# Patient Record
Sex: Male | Born: 1965
Health system: Southern US, Community
[De-identification: ages and names within clinical notes are randomized; demographics above are authoritative.]

## PROBLEM LIST (undated history)

## (undated) DIAGNOSIS — K219 Gastro-esophageal reflux disease without esophagitis: Secondary | ICD-10-CM

## (undated) DIAGNOSIS — K5792 Diverticulitis of intestine, part unspecified, without perforation or abscess without bleeding: Secondary | ICD-10-CM

## (undated) HISTORY — DX: Diverticulitis of intestine, part unspecified, without perforation or abscess without bleeding: K57.92

## (undated) HISTORY — DX: Gastro-esophageal reflux disease without esophagitis: K21.9

---

## 2004-07-12 ENCOUNTER — Emergency Department (HOSPITAL_COMMUNITY): Admission: EM | Admit: 2004-07-12 | Discharge: 2004-07-12 | Payer: Self-pay | Admitting: Emergency Medicine

## 2004-08-30 ENCOUNTER — Ambulatory Visit (HOSPITAL_COMMUNITY): Admission: RE | Admit: 2004-08-30 | Discharge: 2004-08-30 | Payer: Self-pay | Admitting: Gastroenterology

## 2004-09-07 ENCOUNTER — Encounter: Admission: RE | Admit: 2004-09-07 | Discharge: 2004-09-07 | Payer: Self-pay | Admitting: Gastroenterology

## 2005-11-28 HISTORY — PX: COLECTOMY: SHX59

## 2005-12-02 ENCOUNTER — Emergency Department (HOSPITAL_COMMUNITY): Admission: EM | Admit: 2005-12-02 | Discharge: 2005-12-02 | Payer: Self-pay | Admitting: Family Medicine

## 2006-09-08 ENCOUNTER — Inpatient Hospital Stay (HOSPITAL_COMMUNITY): Admission: EM | Admit: 2006-09-08 | Discharge: 2006-09-17 | Payer: Self-pay | Admitting: Emergency Medicine

## 2006-09-09 ENCOUNTER — Encounter (INDEPENDENT_AMBULATORY_CARE_PROVIDER_SITE_OTHER): Payer: Self-pay | Admitting: Specialist

## 2006-11-13 ENCOUNTER — Encounter: Admission: RE | Admit: 2006-11-13 | Discharge: 2006-11-13 | Payer: Self-pay | Admitting: General Surgery

## 2006-11-28 HISTORY — PX: COLON SURGERY: SHX602

## 2006-11-28 HISTORY — PX: APPENDECTOMY: SHX54

## 2006-12-15 ENCOUNTER — Inpatient Hospital Stay (HOSPITAL_COMMUNITY): Admission: RE | Admit: 2006-12-15 | Discharge: 2006-12-22 | Payer: Self-pay | Admitting: General Surgery

## 2006-12-15 ENCOUNTER — Encounter (INDEPENDENT_AMBULATORY_CARE_PROVIDER_SITE_OTHER): Payer: Self-pay | Admitting: Specialist

## 2007-01-24 ENCOUNTER — Encounter: Admission: RE | Admit: 2007-01-24 | Discharge: 2007-01-24 | Payer: Self-pay | Admitting: General Surgery

## 2007-02-02 ENCOUNTER — Encounter: Admission: RE | Admit: 2007-02-02 | Discharge: 2007-02-02 | Payer: Self-pay | Admitting: Internal Medicine

## 2007-02-02 ENCOUNTER — Inpatient Hospital Stay (HOSPITAL_COMMUNITY): Admission: EM | Admit: 2007-02-02 | Discharge: 2007-02-14 | Payer: Self-pay | Admitting: Emergency Medicine

## 2007-02-08 ENCOUNTER — Encounter (INDEPENDENT_AMBULATORY_CARE_PROVIDER_SITE_OTHER): Payer: Self-pay | Admitting: *Deleted

## 2007-02-27 ENCOUNTER — Encounter: Admission: RE | Admit: 2007-02-27 | Discharge: 2007-02-27 | Payer: Self-pay | Admitting: General Surgery

## 2007-08-15 ENCOUNTER — Encounter: Admission: RE | Admit: 2007-08-15 | Discharge: 2007-08-15 | Payer: Self-pay | Admitting: Internal Medicine

## 2007-08-31 ENCOUNTER — Ambulatory Visit (HOSPITAL_COMMUNITY): Admission: RE | Admit: 2007-08-31 | Discharge: 2007-08-31 | Payer: Self-pay | Admitting: General Surgery

## 2007-09-04 ENCOUNTER — Ambulatory Visit (HOSPITAL_COMMUNITY): Admission: RE | Admit: 2007-09-04 | Discharge: 2007-09-04 | Payer: Self-pay | Admitting: General Surgery

## 2007-09-06 ENCOUNTER — Inpatient Hospital Stay (HOSPITAL_COMMUNITY): Admission: EM | Admit: 2007-09-06 | Discharge: 2007-09-11 | Payer: Self-pay | Admitting: General Surgery

## 2007-10-16 ENCOUNTER — Ambulatory Visit (HOSPITAL_COMMUNITY): Admission: RE | Admit: 2007-10-16 | Discharge: 2007-10-16 | Payer: Self-pay | Admitting: General Surgery

## 2007-11-07 ENCOUNTER — Inpatient Hospital Stay (HOSPITAL_COMMUNITY): Admission: RE | Admit: 2007-11-07 | Discharge: 2007-11-20 | Payer: Self-pay | Admitting: General Surgery

## 2007-11-07 ENCOUNTER — Encounter (INDEPENDENT_AMBULATORY_CARE_PROVIDER_SITE_OTHER): Payer: Self-pay | Admitting: General Surgery

## 2007-11-26 ENCOUNTER — Ambulatory Visit (HOSPITAL_COMMUNITY): Admission: RE | Admit: 2007-11-26 | Discharge: 2007-11-26 | Payer: Self-pay | Admitting: General Surgery

## 2007-11-27 ENCOUNTER — Ambulatory Visit (HOSPITAL_COMMUNITY): Admission: RE | Admit: 2007-11-27 | Discharge: 2007-11-27 | Payer: Self-pay | Admitting: General Surgery

## 2007-11-29 HISTORY — PX: ABDOMINAL DEBRIDEMENT: SHX1109

## 2007-12-05 ENCOUNTER — Ambulatory Visit (HOSPITAL_COMMUNITY): Admission: RE | Admit: 2007-12-05 | Discharge: 2007-12-05 | Payer: Self-pay | Admitting: General Surgery

## 2007-12-10 ENCOUNTER — Ambulatory Visit (HOSPITAL_COMMUNITY): Admission: RE | Admit: 2007-12-10 | Discharge: 2007-12-10 | Payer: Self-pay | Admitting: General Surgery

## 2007-12-26 ENCOUNTER — Encounter: Admission: RE | Admit: 2007-12-26 | Discharge: 2007-12-26 | Payer: Self-pay | Admitting: Surgery

## 2008-01-15 ENCOUNTER — Encounter: Admission: RE | Admit: 2008-01-15 | Discharge: 2008-01-15 | Payer: Self-pay | Admitting: General Surgery

## 2008-02-16 ENCOUNTER — Encounter (INDEPENDENT_AMBULATORY_CARE_PROVIDER_SITE_OTHER): Payer: Self-pay | Admitting: General Surgery

## 2008-02-16 ENCOUNTER — Ambulatory Visit (HOSPITAL_COMMUNITY): Admission: RE | Admit: 2008-02-16 | Discharge: 2008-02-16 | Payer: Self-pay | Admitting: General Surgery

## 2008-11-10 ENCOUNTER — Ambulatory Visit: Payer: Self-pay | Admitting: Internal Medicine

## 2008-11-14 DIAGNOSIS — Z8719 Personal history of other diseases of the digestive system: Secondary | ICD-10-CM | POA: Insufficient documentation

## 2008-11-14 DIAGNOSIS — K219 Gastro-esophageal reflux disease without esophagitis: Secondary | ICD-10-CM | POA: Insufficient documentation

## 2008-12-07 IMAGING — RF DG BE THRU COLOSTOMY
19 series · 19 of 19 positions shown · non-contrast
Comparison: none

CLINICAL DATA: Diverticulitis now with colostomy for reversal. 
BARIUM ENEMA THROUGH RECTUM INTO COLOSTOMY:
KUB:  A supine film of the abdomen shows a non specific bowel gas pattern.  An ostomy is noted in the left lower quadrant. 
Initially barium enema was performed via rectum.  The best possible study was performed in this patient who had extreme difficulty retaining the barium and balloon tip.  The rectosigmoid colon tapers near the ostomy presumably post operative in nature.  It is difficult to exclude a very small diverticulum at the site of closure.  Post evacuation film shows no abnormality.  Barium via the colostomy was then performed.  There are two or three small diverticula in the descending colon just above the ostomy.  No other diverticula are noted.  Cecum and appendix fill normally.  No reflux into the terminal ileum was obtained.

[Series 1: run · 1 of 1 slices shown (1 of 19)]
[im 1/1]
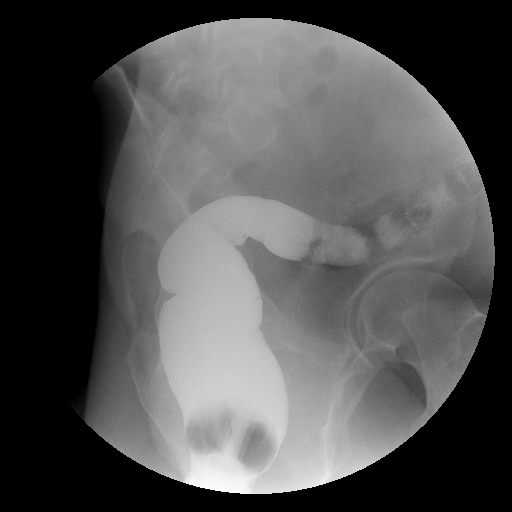

[Series 2: run · 1 of 1 slices shown (2 of 19)]
[im 1/1]
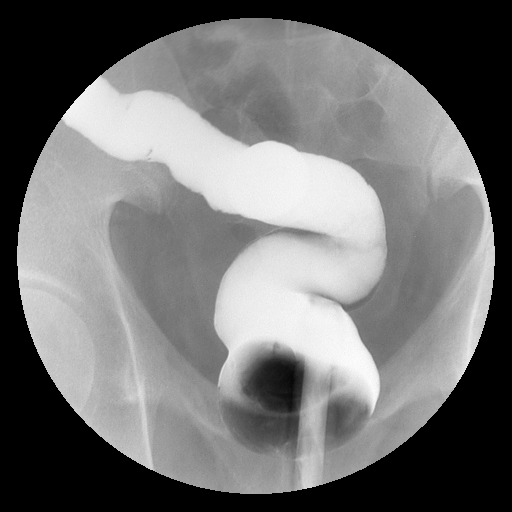

[Series 3: run · 1 of 1 slices shown (3 of 19)]
[im 1/1]
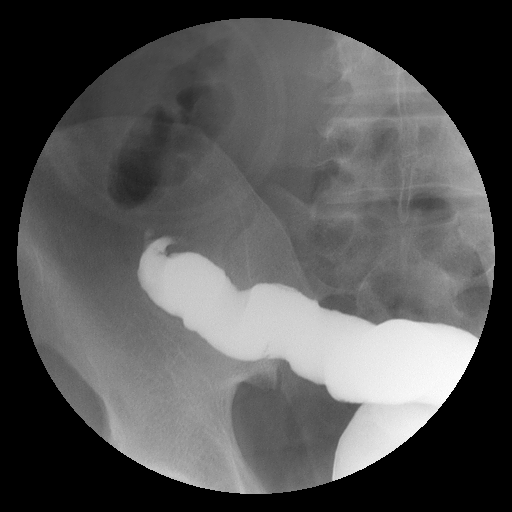

[Series 4: run · 1 of 1 slices shown (4 of 19)]
[im 1/1]
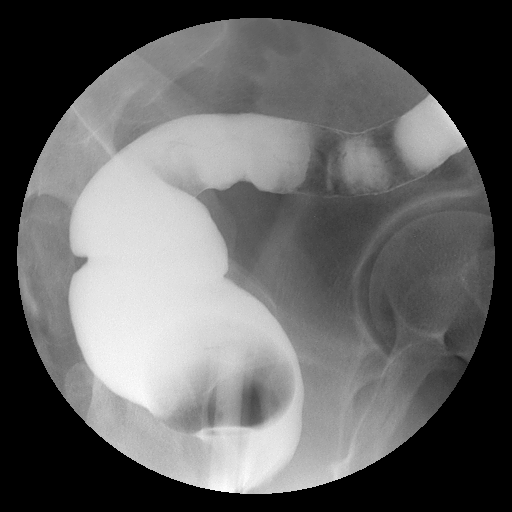

[Series 5: run · 1 of 1 slices shown (5 of 19)]
[im 1/1]
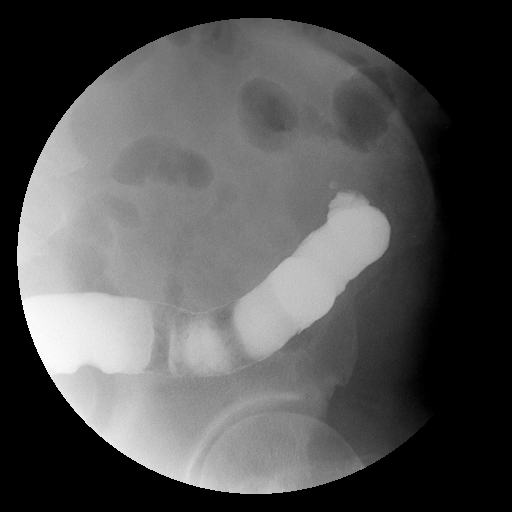

[Series 6: run · 1 of 1 slices shown (6 of 19)]
[im 1/1]
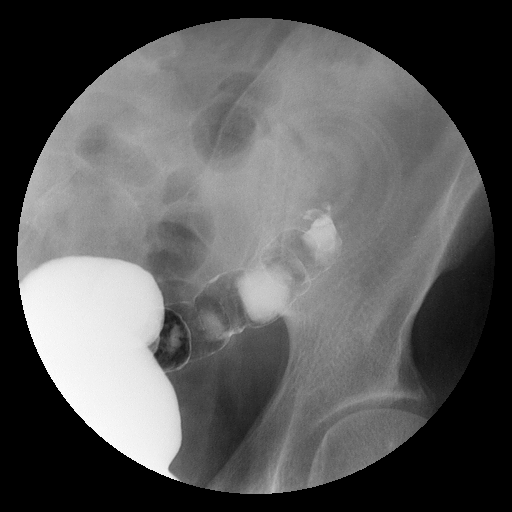

[Series 7: run · 1 of 1 slices shown (7 of 19)]
[im 1/1]
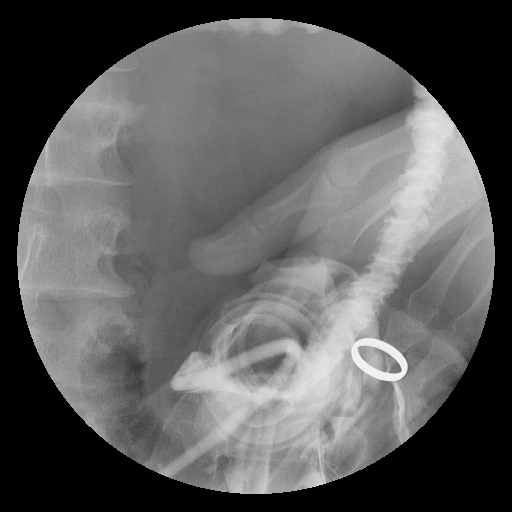

[Series 8: run · 1 of 1 slices shown (8 of 19)]
[im 1/1]
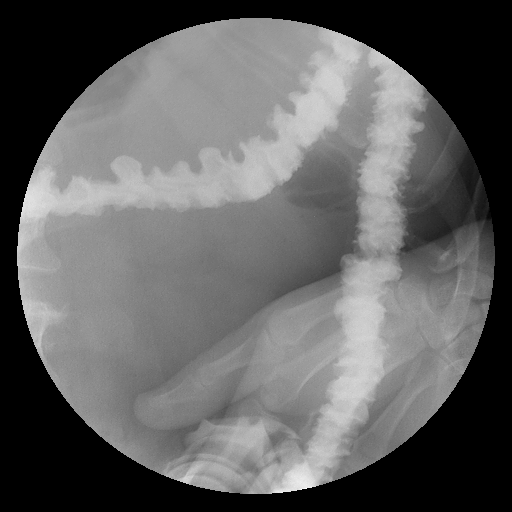

[Series 9: run · 1 of 1 slices shown (9 of 19)]
[im 1/1]
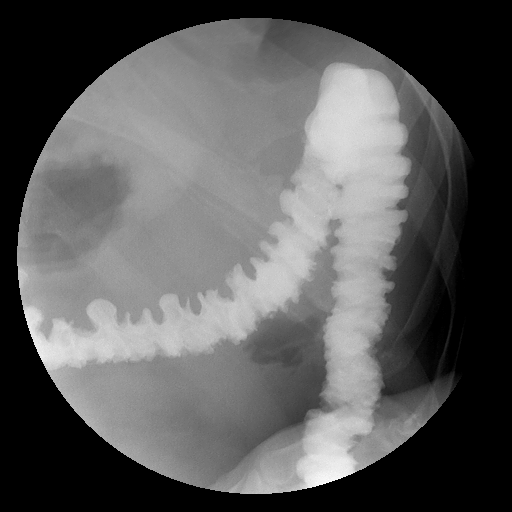

[Series 10: run · 1 of 1 slices shown (10 of 19)]
[im 1/1]
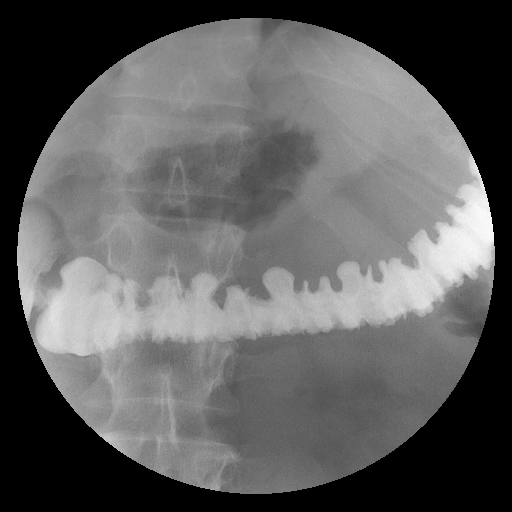

[Series 11: run · 1 of 1 slices shown (11 of 19)]
[im 1/1]
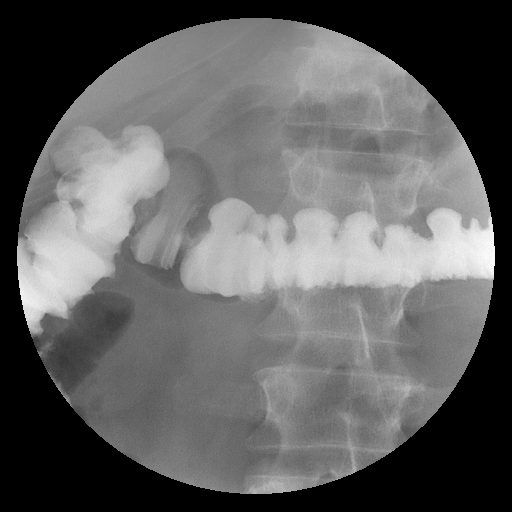

[Series 12: run · 1 of 1 slices shown (12 of 19)]
[im 1/1]
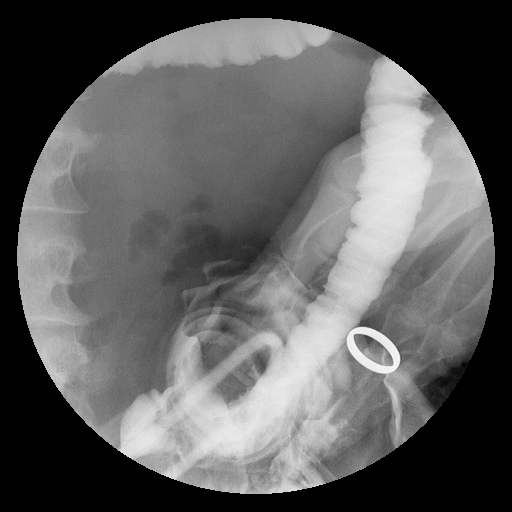

[Series 13: run · 1 of 1 slices shown (13 of 19)]
[im 1/1]
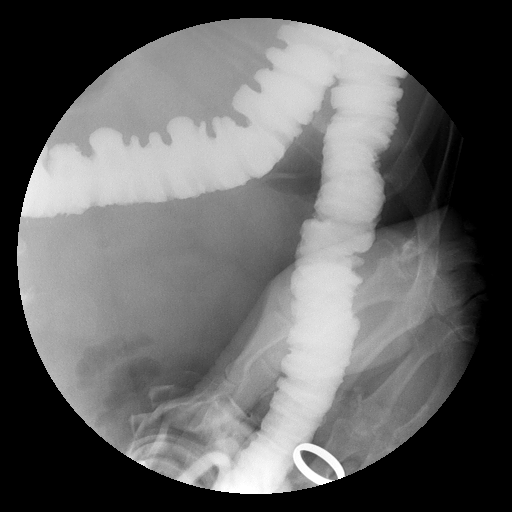

[Series 14: run · 1 of 1 slices shown (14 of 19)]
[im 1/1]
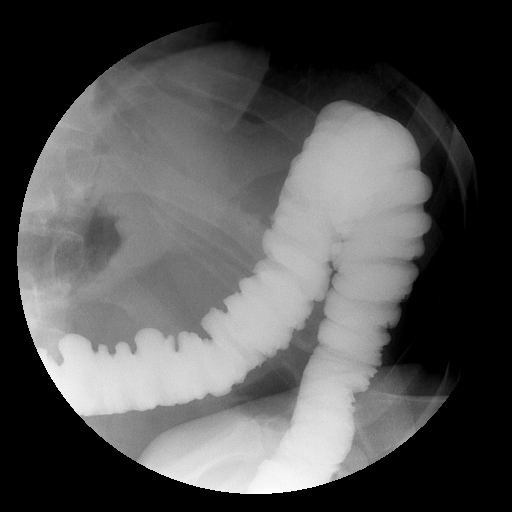

[Series 15: run · 1 of 1 slices shown (15 of 19)]
[im 1/1]
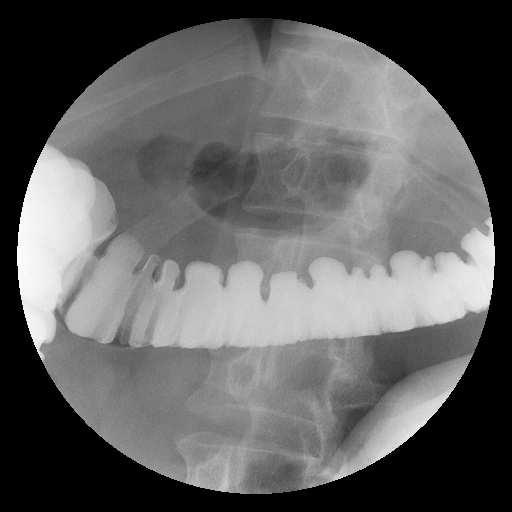

[Series 16: run · 1 of 1 slices shown (16 of 19)]
[im 1/1]
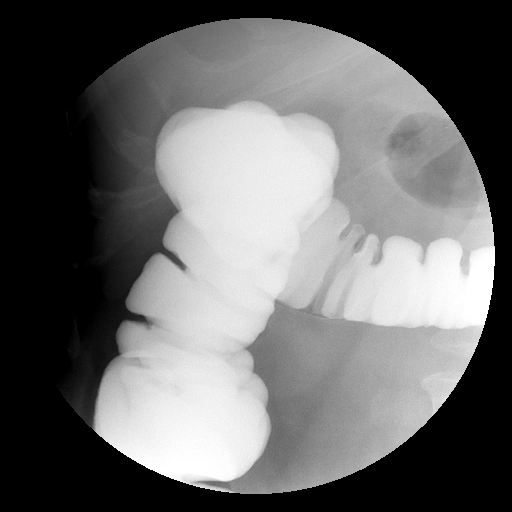

[Series 17: run · 1 of 1 slices shown (17 of 19)]
[im 1/1]
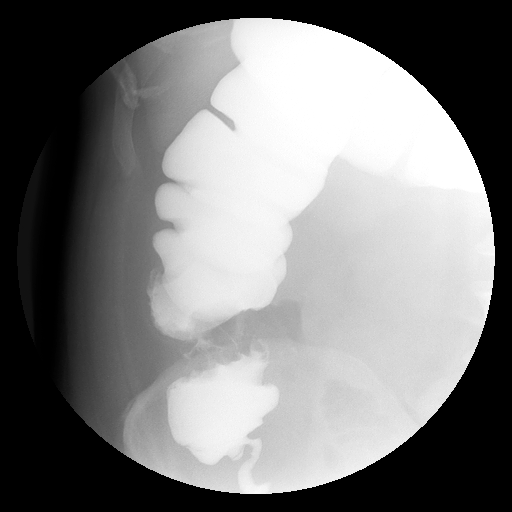

[Series 18: run · 1 of 1 slices shown (18 of 19)]
[im 1/1]
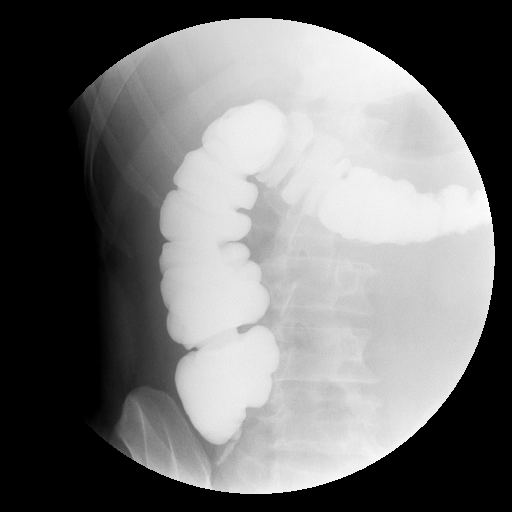

[Series 19: run · 1 of 1 slices shown (19 of 19)]
[im 1/1]
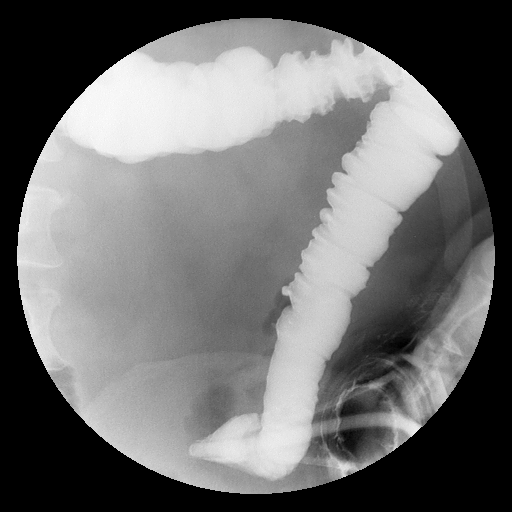

[19 of 19 positions shown; findings below may reference images not displayed]

IMPRESSION: 1.  There are two or three small diverticula in the mid descending colon.  No other diverticula or colonic lesion is seen. 
2.  Tapering of rectosigmoid colon near ostomy.  Post surgical defect vs possible small diverticulum near the site of surgical closure.

## 2009-02-19 ENCOUNTER — Encounter: Payer: Self-pay | Admitting: Internal Medicine

## 2009-02-20 ENCOUNTER — Encounter: Admission: RE | Admit: 2009-02-20 | Discharge: 2009-02-20 | Payer: Self-pay | Admitting: Gastroenterology

## 2010-04-01 ENCOUNTER — Ambulatory Visit: Payer: Self-pay | Admitting: Internal Medicine

## 2010-04-01 LAB — CONVERTED CEMR LAB
AST: 22 units/L (ref 0–37)
Albumin: 4.1 g/dL (ref 3.5–5.2)
Alkaline Phosphatase: 59 units/L (ref 39–117)
BUN: 8 mg/dL (ref 6–23)
Basophils Relative: 0.3 % (ref 0.0–3.0)
CO2: 29 meq/L (ref 19–32)
Eosinophils Absolute: 0.2 10*3/uL (ref 0.0–0.7)
HDL: 29.4 mg/dL — ABNORMAL LOW (ref >39.00)
Hemoglobin: 14.8 g/dL (ref 13.0–17.0)
Lymphocytes Relative: 27.4 % (ref 12.0–46.0)
MCHC: 34.6 g/dL (ref 30.0–36.0)
Monocytes Relative: 6.9 % (ref 3.0–12.0)
Neutro Abs: 5.4 10*3/uL (ref 1.4–7.7)
Potassium: 5.2 meq/L — ABNORMAL HIGH (ref 3.5–5.1)
RBC: 4.47 M/uL (ref 4.22–5.81)
Sodium: 142 meq/L (ref 135–145)
Total CHOL/HDL Ratio: 6
Total Protein: 6.6 g/dL (ref 6.0–8.3)

## 2010-04-02 ENCOUNTER — Encounter: Payer: Self-pay | Admitting: Internal Medicine

## 2010-08-27 ENCOUNTER — Ambulatory Visit: Payer: Self-pay | Admitting: Internal Medicine

## 2010-08-27 ENCOUNTER — Encounter: Payer: Self-pay | Admitting: Internal Medicine

## 2010-08-27 DIAGNOSIS — M25569 Pain in unspecified knee: Secondary | ICD-10-CM | POA: Insufficient documentation

## 2010-08-27 DIAGNOSIS — M109 Gout, unspecified: Secondary | ICD-10-CM | POA: Insufficient documentation

## 2010-12-19 ENCOUNTER — Encounter: Payer: Self-pay | Admitting: General Surgery

## 2010-12-28 NOTE — Letter (Signed)
Summary: Lipid Letter  [facility]  [address] [place], [place] [address]   Phone: [phone]  Fax: [phone]    04/02/2010  Edwards Mckelvie [address] [place], [place]  [address]  Dear Christopher Guerrero:  We have carefully reviewed your last lipid profile from 04/01/2010 and the results are noted below with a summary of recommendations for lipid management.    Cholesterol:       162     Goal: <200   HDL "good" Cholesterol:   38.75     Goal: >40   LDL "bad" Cholesterol:   95     Goal: <130   Triglycerides:       187.0     Goal: <150        TLC Diet (Therapeutic Lifestyle Change): Saturated Fats & Transfatty acids should be kept < 7% of total calories ***Reduce Saturated Fats Polyunstaurated Fat can be up to 10% of total calories Monounsaturated Fat Fat can be up to 20% of total calories Total Fat should be no greater than 25-35% of total calories Carbohydrates should be 50-60% of total calories Protein should be approximately 15% of total calories Fiber should be at least 20-30 grams a day ***Increased fiber may help lower LDL Total Cholesterol should be < 200mg /day Consider adding plant stanol/sterols to diet (example: Benacol spread) ***A higher intake of unsaturated fat may reduce Triglycerides and Increase HDL    Adjunctive Measures (may lower LIPIDS and reduce risk of Heart Attack) include: Aerobic Exercise (20-30 minutes 3-4 times a week) Limit Alcohol Consumption Weight Reduction Aspirin 75-81 mg a day by mouth (if not allergic or contraindicated) Dietary Fiber 20-30 grams a day by mouth     Current Medications: 1)    Prevacid Solutab 30 Mg Tbdp (Lansoprazole) .... One by mouth once daily 2)    Colcrys 0.6 Mg Tabs (Colchicine) .... One by mouth two times a day for gout pain  If you have any questions, please call. We appreciate being able to work with you.   Sincerely,    [facility] Etta Grandchild MD

## 2010-12-28 NOTE — Assessment & Plan Note (Signed)
Summary: RED HOT SWOLLEN TOE--STC   Vital Signs:  Patient profile:   45 year old male Height:      71 inches Weight:      208 pounds BMI:     29.11 O2 Sat:      98 % on Room air Temp:     97.7 degrees F oral Pulse rate:   66 / minute Pulse rhythm:   regular Resp:     16 per minute BP sitting:   122 / 86  (left arm) Cuff size:   large  Vitals Entered By: Rock Nephew CMA (Apr 01, 2010 10:09 AM)  Nutrition Counseling: Patient's BMI is greater than 25 and therefore counseled on weight management options.  O2 Flow:  Room air CC: L toe swelling and pain x 1 1/2wk with dizziness Is Patient Diabetic? No   Primary Care Provider:  Etta Grandchild MD  CC:  L toe swelling and pain x 1 1/2wk with dizziness.  History of Present Illness: He returns c/o 1.5 week hx. of pain, redness, and swelling in his left 4 th toe and intermittent dizziness. The dizziness occurred last weekend after he had been working in the yard and drinking beer. He describes an orthostasis type of dizziness.  Preventive Screening-Counseling & Management  Alcohol-Tobacco     Alcohol drinks/day: 0     Smoking Status: current     Smoking Cessation Counseling: yes     Packs/Day: 1     Year Started: 1991     Cans of tobacco/week: no     Passive Smoke Exposure: yes  Hep-HIV-STD-Contraception     Hepatitis Risk: no risk noted     HIV Risk: no     STD Risk: no risk noted  Current Medications (verified): 1)  Prevacid Solutab 30 Mg Tbdp (Lansoprazole) .... One By Mouth Once Daily  Allergies (verified): No Known Drug Allergies  Past History:  Past Medical History: Reviewed history from 11/10/2008 and no changes required. Diverticulitis, hx of GERD  Past Surgical History: Reviewed history from 11/10/2008 and no changes required. Appendectomy Colectomy  Family History: Reviewed history from 11/10/2008 and no changes required. Family History of Arthritis Family History of CAD Male 1st degree relative  <50 Family History Hypertension  Social History: Reviewed history from 11/10/2008 and no changes required. Occupation: Engineer, site Married Current Smoker Alcohol use-no Drug use-no Regular exercise-yes Hepatitis Risk:  no risk noted STD Risk:  no risk noted  Review of Systems  The patient denies anorexia, fever, weight loss, weight gain, vision loss, chest pain, syncope, dyspnea on exertion, peripheral edema, prolonged cough, headaches, hemoptysis, abdominal pain, melena, hematochezia, severe indigestion/heartburn, hematuria, suspicious skin lesions, transient blindness, difficulty walking, depression, enlarged lymph nodes, angioedema, and testicular masses.    Physical Exam  General:  alert, well-developed, well-nourished, well-hydrated, appropriate dress, normal appearance, healthy-appearing, and cooperative to examination.   Head:  normocephalic, atraumatic, no abnormalities observed, and no abnormalities palpated.   Eyes:  vision grossly intact, pupils equal, pupils round, and pupils reactive to light.   Mouth:  Oral mucosa and oropharynx without lesions or exudates.  Teeth in good repair. Neck:  supple, full ROM, no masses, no thyromegaly, no thyroid nodules or tenderness, no JVD, normal carotid upstroke, and no carotid bruits.   Lungs:  normal respiratory effort, no intercostal retractions, no accessory muscle use, normal breath sounds, no dullness, no fremitus, no crackles, and no wheezes.   Heart:  normal rate, regular rhythm, no murmur, no  gallop, no rub, and no JVD.   Abdomen:  soft, non-tender, normal bowel sounds, no distention, no masses, no guarding, no rigidity, no rebound tenderness, no abdominal hernia, no inguinal hernia, no hepatomegaly, no splenomegaly, and abdominal scar(s).   Rectal:  No external abnormalities noted. Normal sphincter tone. No rectal masses or tenderness. heme negative stool. Genitalia:  circumcised, no hydrocele, no varicocele, no scrotal  masses, no testicular masses or atrophy, no cutaneous lesions, and no urethral discharge.   Prostate:  no nodules, no asymmetry, no induration, and 1+ enlarged.   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 there is erythema and swelling but no warmth or streaking. Pulses:  R and L carotid,radial,femoral,dorsalis pedis and posterior tibial pulses are full and equal bilaterally Extremities:  No clubbing, cyanosis, edema, or deformity noted with normal full range of motion of all joints.   Neurologic:  No cranial nerve deficits noted. Station and gait are normal. Plantar reflexes are down-going bilaterally. DTRs are symmetrical throughout. Sensory, motor and coordinative functions appear intact. Skin:  turgor normal, color normal, no rashes, no suspicious lesions, no ecchymoses, no petechiae, no purpura, no ulcerations, and no edema.   Cervical Nodes:  no anterior cervical adenopathy and no posterior cervical adenopathy.   Axillary Nodes:  no R axillary adenopathy and no L axillary adenopathy.   Inguinal Nodes:  no R inguinal adenopathy and no L inguinal adenopathy.   Psych:  Cognition and judgment appear intact. Alert and cooperative with normal attention span and concentration. No apparent delusions, illusions, hallucinations Additional Exam:  EKG is normal.   Impression & Recommendations:  Problem # 1:  DIZZINESS (ICD-780.4) Assessment New  Orders: Venipuncture (13086) TLB-Lipid Panel (80061-LIPID) TLB-BMP (Basic Metabolic Panel-BMET) (80048-METABOL) TLB-CBC Platelet - w/Differential (85025-CBCD) TLB-Hepatic/Liver Function Pnl (80076-HEPATIC) TLB-TSH (Thyroid Stimulating Hormone) (84443-TSH) TLB-Uric Acid, Blood (84550-URIC) TLB-Sedimentation Rate (ESR) (85652-ESR) EKG w/ Interpretation (93000)  Problem # 2:  ACUTE GOUTY ARTHROPATHY (ICD-274.01) Assessment: New  His updated medication list for this problem includes:    Colcrys 0.6 Mg  Tabs (Colchicine) ..... One by mouth two times a day for gout pain  Orders: Venipuncture (57846) TLB-Lipid Panel (80061-LIPID) TLB-BMP (Basic Metabolic Panel-BMET) (80048-METABOL) TLB-CBC Platelet - w/Differential (85025-CBCD) TLB-Hepatic/Liver Function Pnl (80076-HEPATIC) TLB-TSH (Thyroid Stimulating Hormone) (84443-TSH) TLB-Uric Acid, Blood (84550-URIC) TLB-Sedimentation Rate (ESR) (85652-ESR) T-Toe(s) (73660TC)  Problem # 3:  GERD (ICD-530.81) Assessment: Improved  His updated medication list for this problem includes:    Prevacid Solutab 30 Mg Tbdp (Lansoprazole) ..... One by mouth once daily  Orders: Venipuncture (96295) TLB-Lipid Panel (80061-LIPID) TLB-BMP (Basic Metabolic Panel-BMET) (80048-METABOL) TLB-CBC Platelet - w/Differential (85025-CBCD) TLB-Hepatic/Liver Function Pnl (80076-HEPATIC) TLB-TSH (Thyroid Stimulating Hormone) (84443-TSH) TLB-Uric Acid, Blood (84550-URIC) TLB-Sedimentation Rate (ESR) (85652-ESR) Hemoccult Guaiac-1 spec.(in office) (82270)  Complete Medication List: 1)  Prevacid Solutab 30 Mg Tbdp (Lansoprazole) .... One by mouth once daily 2)  Colcrys 0.6 Mg Tabs (Colchicine) .... One by mouth two times a day for gout pain  Patient Instructions: 1)  Please schedule a follow-up appointment in 2 weeks. 2)  To prevent gout attacks,avoid purine rich foods, such as beer, beans & peas, and meat gravies. Prescriptions: COLCRYS 0.6 MG TABS (COLCHICINE) One by mouth two times a day for gout pain  #60 x 11   Entered and Authorized by:   Etta Grandchild MD   Signed by:   Etta Grandchild MD on 04/01/2010   Method used:   Print  then Give to Patient   RxID:   [id number]    Medication Administration  Injection # 1:    Medication: Depo- Medrol 80mg     Route: IM    Site: R deltoid    Exp Date: 09/2012    Lot #: [id number]    Mfr: pfizer    Patient tolerated injection without complications    Given by: Rock Nephew CMA (Apr 01, 2010 11:25  AM)  Injection # 2:    Medication: Depo- Medrol 40mg     Route: IM    Site: R deltoid    Exp Date: 09/2012    Lot #: [id number]    Mfr: pfizer    Patient tolerated injection without complications    Given by: Rock Nephew CMA (Apr 01, 2010 11:25 AM)  Orders Added: 1)  Venipuncture [46962] 2)  TLB-Lipid Panel [80061-LIPID] 3)  TLB-BMP (Basic Metabolic Panel-BMET) [80048-METABOL] 4)  TLB-CBC Platelet - w/Differential [85025-CBCD] 5)  TLB-Hepatic/Liver Function Pnl [80076-HEPATIC] 6)  TLB-TSH (Thyroid Stimulating Hormone) [84443-TSH] 7)  TLB-Uric Acid, Blood [84550-URIC] 8)  TLB-Sedimentation Rate (ESR) [85652-ESR] 9)  T-Toe(s) [73660TC] 10)  Hemoccult Guaiac-1 spec.(in office) [82270] 11)  EKG w/ Interpretation [93000] 12)  Est. Patient Level IV [95284]

## 2010-12-28 NOTE — Letter (Signed)
Summary: Results Follow-up Letter  [facility]  [address] [place], [place] [address]   Phone: [phone]  Fax: [phone]    04/02/2010  [address] [place], [place]  [address]  Dear Mr. HOLIFIELD,   The following are the results of your recent test(s):  Test     Result     Gout level     high Potassium     slightly high Liver/kidney   normal CBC       normal Thyroid     normal   _________________________________________________________  Please call for an appointment in 2 weeks _________________________________________________________ _________________________________________________________ _________________________________________________________  Sincerely,  Sanda Linger MD [facility]

## 2010-12-28 NOTE — Letter (Signed)
Summary: Results Follow-up Letter  [facility]  [address] [place], [place] [address]   Phone: [phone]  Fax: [phone]    08/27/2010  [address] [place], [place]  [address]  Dear Mr. CHAPPLE,   The following are the results of your recent test(s):  Test     Result     knee xray     normal   _________________________________________________________  Please call for an appointment as directed _________________________________________________________ _________________________________________________________ _________________________________________________________  Sincerely,  Sanda Linger MD [facility]

## 2010-12-28 NOTE — Assessment & Plan Note (Signed)
Summary: Christopher Guerrero   Vital Signs:  Patient profile:   45 year old male Height:      71 inches Weight:      204 pounds BMI:     28.56 O2 Sat:      97 % on Room air Temp:     97.0 degrees F oral Pulse rate:   66 / minute Pulse rhythm:   regular Resp:     16 per minute BP sitting:   114 / 80  (left arm) Cuff size:   large  Vitals Entered By: Rock Nephew CMA (August 27, 2010 8:22 AM)  Nutrition Counseling: Patient's BMI is greater than 25 and therefore counseled on weight management options.  O2 Flow:  Room air CC: Pt c/o Right side Christopher pain x 2wks Is Patient Diabetic? No  Does patient need assistance? Functional Status Self care Ambulation Normal   Primary Care Provider:  Etta Grandchild MD  CC:  Pt c/o Right side Christopher pain x 2wks.  History of Present Illness: He returns c/o 3 week hx. of pain in his right Christopher after squatting and kneeling down for a long period of time but he did not have any acute trauma. The pain is located around the patella and has gradually improved over the last week.  Dyspepsia History:      The patient has positive alarm features of dyspepsia which include history of anemia.  There is a prior history of GERD.  The patient does not have a prior history of documented ulcer disease.  The dominant symptom is heartburn or acid reflux.  An H-2 blocker medication is currently being taken.  He notes that the symptoms have improved with the H-2 blocker therapy.  Symptoms have not persisted after 4 weeks of H-2 blocker treatment.  A prior EGD has been done which showed no evidence for moderate or severe esophagitis or Barrett's esophagus.     Current Medications (verified): 1)  Dexilant 60 Mg Cpdr (Dexlansoprazole) .... Take 1 Tablet By Mouth Once A Day 2)  Colcrys 0.6 Mg Tabs (Colchicine) .... One By Mouth Two Times A Day For Gout Pain  Allergies (verified): No Known Drug Allergies  Past History:  Past  Surgical History: Last updated: 11/10/2008 Appendectomy Colectomy  Family History: Last updated: 11/10/2008 Family History of Arthritis Family History of CAD Male 1st degree relative <50 Family History Hypertension  Social History: Last updated: 11/10/2008 Occupation: Engineer, site Married Current Smoker Alcohol use-no Drug use-no Regular exercise-yes  Risk Factors: Alcohol Use: 0 (04/01/2010) Exercise: yes (11/10/2008)  Risk Factors: Smoking Status: current (04/01/2010) Packs/Day: 1 (04/01/2010) Cans of tobacco/wk: no (04/01/2010) Passive Smoke Exposure: yes (04/01/2010)  Past Medical History: Diverticulitis, hx of GERD Gout  Family History: Reviewed history from 11/10/2008 and no changes required. Family History of Arthritis Family History of CAD Male 1st degree relative <50 Family History Hypertension  Social History: Reviewed history from 11/10/2008 and no changes required. Occupation: Engineer, site Married Current Smoker Alcohol use-no Drug use-no Regular exercise-yes  Review of Systems  The patient denies anorexia, weight loss, chest pain, syncope, dyspnea on exertion, peripheral edema, prolonged cough, headaches, hemoptysis, abdominal pain, melena, hematochezia, muscle weakness, and enlarged lymph nodes.   MS:  Complains of joint pain and stiffness; denies joint redness, joint swelling, loss of strength, low back pain, muscle aches, muscle, cramps, and muscle weakness.  Physical Exam  General:  alert, well-developed, well-nourished, well-hydrated, appropriate dress, normal appearance,  healthy-appearing, and cooperative to examination.   Mouth:  Oral mucosa and oropharynx without lesions or exudates.  Teeth in good repair. Neck:  supple, full ROM, no masses, no thyromegaly, no thyroid nodules or tenderness, no JVD, normal carotid upstroke, and no carotid bruits.   Lungs:  normal respiratory effort, no intercostal retractions, no accessory muscle  use, normal breath sounds, no dullness, no fremitus, no crackles, and no wheezes.   Heart:  normal rate, regular rhythm, no murmur, no gallop, no rub, and no JVD.   Abdomen:  soft, non-tender, normal bowel sounds, no distention, no masses, no guarding, no rigidity, no rebound tenderness, no abdominal hernia, no inguinal hernia, no hepatomegaly, no splenomegaly, and abdominal scar(s).   Msk:  right Christopher has mild ttp around the patella but there is no crepitance and there is FROM with no jont laxity. normal ROM, no joint swelling, no joint warmth, no redness over joints, no joint deformities, no joint instability, and no crepitation.   Pulses:  R and L carotid,radial,femoral,dorsalis pedis and posterior tibial pulses are full and equal bilaterally Extremities:  No clubbing, cyanosis, edema, or deformity noted with normal full range of motion of all joints.   Neurologic:  No cranial nerve deficits noted. Station and gait are normal. Plantar reflexes are down-going bilaterally. DTRs are symmetrical throughout. Sensory, motor and coordinative functions appear intact. Skin:  turgor normal, color normal, no rashes, no suspicious lesions, no ecchymoses, no petechiae, no purpura, no ulcerations, and no edema.   Psych:  Cognition and judgment appear intact. Alert and cooperative with normal attention span and concentration. No apparent delusions, illusions, hallucinations   Impression & Recommendations:  Problem # 1:  GOUT (ICD-274.9) Assessment Improved  His updated medication list for this problem includes:    Colcrys 0.6 Mg Tabs (Colchicine) ..... One by mouth two times a day for gout pain  Problem # 2:  Christopher PAIN, RIGHT (ICD-719.46) this looks like patellar tendonitis but will check plaoin films to look for DJD, will try Pennsaid for relief of pain Orders: T-Christopher Comp Right 4 Views ([id number])  Problem # 3:  GERD (ICD-530.81) Assessment: Unchanged  His updated medication list for this problem  includes:    Dexilant 60 Mg Cpdr (Dexlansoprazole) .Marland Kitchen... Take 1 tablet by mouth once a day  Complete Medication List: 1)  Dexilant 60 Mg Cpdr (Dexlansoprazole) .... Take 1 tablet by mouth once a day 2)  Colcrys 0.6 Mg Tabs (Colchicine) .... One by mouth two times a day for gout pain 3)  Pennsaid 1.5 % Soln (Diclofenac sodium) .... Apply to Christopher three times a day as needed as diected  Other Orders: Admin 1st Vaccine (45409) Flu Vaccine 85yrs + (81191)  Patient Instructions: 1)  Please schedule a follow-up appointment in 2 months. 2)  Avoid foods high in acid (tomatoes, citrus juices, spicy foods). Avoid eating within two hours of lying down or before exercising. Do not over eat; try smaller more frequent meals. Elevate head of bed twelve inches when sleeping. 3)  To prevent gout attacks,avoid purine rich foods, such as beer, beans & peas, and meat gravies. 4)  Take 650-1000mg  of Tylenol every 4-6 hours as needed for relief of pain or comfort of fever AVOID taking more than 4000mg   in a 24 hour period (can cause liver damage in higher doses). 5)  Take 400-600mg  of Ibuprofen (Advil, Motrin) with food every 4-6 hours as needed for relief of pain or comfort of fever. 6)  You may move  around but avoid painful motions. Apply ice to sore area for 20 minutes 3-4 times a day for 2-3 days. Prescriptions: PENNSAID 1.5 % SOLN (DICLOFENAC SODIUM) Apply to Christopher three times a day as needed as diected  #10 bts x 0   Entered and Authorized by:   Etta Grandchild MD   Signed by:   Etta Grandchild MD on 08/27/2010   Method used:   Samples Given   RxID:   [id number] DEXILANT 60 MG CPDR (DEXLANSOPRAZOLE) Take 1 tablet by mouth once a day  #30 x 11   Entered and Authorized by:   Etta Grandchild MD   Signed by:   Etta Grandchild MD on 08/27/2010   Method used:   Print then Give to Patient   RxID:   [id number]   Preventive Care Screening  Last Tetanus Booster:    Date:  11/29/2007    Results:   Historical    .lbflu  Flu Vaccine Consent Questions     Do you have a history of severe allergic reactions to this vaccine? no    Any prior history of allergic reactions to egg and/or gelatin? no    Do you have a sensitivity to the preservative Thimersol? no    Do you have a past history of Guillan-Barre Syndrome? no    Do you currently have an acute febrile illness? no    Have you ever had a severe reaction to latex? no    Vaccine information given and explained to patient? yes    Are you currently pregnant? no    Lot Number:[id number]   Exp Date:05/28/2011   Site Given  Right Deltoid IM

## 2011-04-12 NOTE — Op Note (Signed)
NAMEMarland Kitchen  Christopher Guerrero, Christopher Guerrero NO.:  [id number]   MEDICAL RECORD NO.:  [id number]          PATIENT TYPE:  INP   LOCATION:  1609                         FACILITY:  [facility]   PHYSICIAN:  Angelia Mould. Derrell Lolling, M.D.DATE OF BIRTH:  06/07/1966   DATE OF PROCEDURE:  11/07/2007  DATE OF DISCHARGE:                               OPERATIVE REPORT   PREOPERATIVE DIAGNOSIS:  Enterocutaneous fistula.   POSTOPERATIVE DIAGNOSES:  1. Enterocutaneous fistula.  2. Colocutaneous fistula.  3. Abdominal wall abscess.   OPERATION PERFORMED:  1. Exploratory laparotomy with resection of enterocutaneous fistula.  2. Segmental small bowel resection.  3. Segmental left colon resection with primary anastomosis.  4. Resection of abdominal wall including skin, subcutaneous tissue,      muscle and fascia.  5. Indicated appendectomy.  6. Complex abdominal wall closure.   SURGEON:  Angelia Mould. Derrell Lolling, M.D.   FIRST ASSISTANT:  Ardeth Sportsman, M.D.   OPERATIVE INDICATIONS:  This is a 45 year old white man whose history  goes back to September 09, 2006 at which time he was operated upon  emergently for perforated diverticulitis and underwent sigmoid colon  resection and colostomy.  Following an uneventful recovery, he underwent  elective colostomy closure on December 15, 2006.  He did well initially  but was rehospitalized in March 2008 with abdominal pain and CT findings  of thickening of the colon around the anastomosis just behind the left  rectus sheath and some air bubbles in the area but no abscess.  He was  treated with bowel rest on hyperalimentation and antibiotics and  recovered.  He was then asymptomatic for 6 months, but then came back to  the office with an abdominal wall abscess at the umbilicus in October of  this year.  CT scan showed some thickening of the left colon below the  left rectus muscle and some air bubbles, suggesting a small bowel  cutaneous fistula.  He was placed on  hyperalimentation and bowel rest as  an outpatient.  A fistulogram was done which showed a little bit of  contrast entering the small bowel.  This was a low output fistula and it  would open and close for weeks and weeks but would never really heal  despite bowel rest and TNA.  He underwent a colonoscopy recently which  showed granulation tissue at the colonic anastomosis but no stricture or  signs of Crohn disease.  He had no evidence of bowel obstruction.  He  was advised to undergo elective laparotomy with the intent to resect  diseased bowel and the fistula.  I did not feel that this was going to  heal and he is brought to the operating room electively following a 2-  day bowel prep at home.   OPERATIVE FINDINGS:  The patient had a loop of small bowel and a loop of  the left colon densely adherent to the anterior abdominal wall just to  the left the umbilicus.  I did identify a couple of fistula tracts with  chronic granulation tissue there.  I found a small abscess between the  small  bowel and the abdominal wall which was cultured.  There was no  sign of any cancer.  There was no sign of any bowel obstruction.  In  some of the fistula tracts the tissues were quite intensely fibrotic and  almost as hard as concrete.  Some of the fistula tracts had some silk  sutures in them.  I question whether this might have been caused by a  foreign body reaction.  There were also a couple of Novofil sutures  present as well.  There was no abscess in the peritoneal cavity.  The  rest of the small intestine except for the 1 loop looked normal.  The  rest of the colon except for the anastomotic loop which was stuck to the  abdominal wall looked normal.  The appendix was caught up in the  inflammatory process but was not primarily inflamed.  The appendix had  to be removed because of its proximity to the inflammatory process.   OPERATIVE TECHNIQUE:  Following induction of general endotracheal   anesthesia, intravenous antibiotics were given.  The patient was  identified as the correct patient and correct procedure.  Foley catheter  and nasogastric tube were placed.  The abdomen was prepped and draped in  a sterile fashion.   Midline laparotomy incision was made.  I excised the old scar.  I  extended the incision above the umbilicus and entered the abdominal  cavity in unoperated territory and there were no adhesions up above.   It took a long time to complete the incision because of dense omental  and small bowel and colonic adhesions to the left anterior abdominal  wall.  We ultimately took all of this down.  We first dissected a loop  of small bowel away from the anterior abdominal wall and entered an  abscess cavity which was probably no bigger than a quarter, this was  cultured.  We placed a suture on this loop of small bowel, this was  clearly the site of the fistula formation.   We then took down all the small bowel adhesions and completely freed the  small bowel up and packed it away cephalad.  We could then see the  rectum and the remaining sigmoid and descending colon.  We had to  dissect the involved loop of colon off of the left rectus sheath with  sharp dissection and we  marked this with a silk suture as well.  The  rest of the colon looked normal.   We resected all of the chronically inflamed fascia and muscle and skin  and subcutaneous tissue from the abdominal wall, but we were able to  preserve the umbilicus.  We sent 1 piece of this which was clearly part  of the fistula tract to the lab.  Hemostasis was achieved with  electrocautery.   Self-retaining retractors were placed.  We performed the colon  anastomosis first.  We isolated the colon about 2 inches proximal and  about 6 inches distal to the inflamed area.  The distal part of the  colon was right at the sacral promontory.  After isolating the colon  distally, I placed stay sutures of 2-0 silk and  then clamped the colon  with a Satinsky clamp and transected it sharply.  Mesenteric vessels  were controlled with the LigaSure device.  Proximally I divided the  colon between Allen clamps.  The specimen was sent to the lab with  appropriate history attached.   I created an anastomosis between  the proximal and distal ends of colon,  which was probably the distal descending and the proximal rectum, with  interrupted sutures of 3-0 Vicryl.  This was a single layer of  interrupted sutures of 3-0 Vicryl.  I chose not to use silk because I  was concerned that the patient may have an unusual reaction to silk.  We  set the anastomosis up with corner sutures that were placed in an  inverting fashion.  The back wall of the anastomosis was formed with  interrupted sutures of 3-0 Vicryl.  At the left and right corners we  then turned in the anastomosis with interrupted inverting sutures of 3-0  Vicryl.  The remaining part of the anterior wall of the anastomosis was  interrupted simple sutures.  This formed a very secure anastomosis with  no bleeding.  The bowel both proximal and distal was pink and had  excellent blood supply.  There was no tension on the anastomosis.  There  was no need to close any mesentery.  The anastomosis lay along the left  paracolic gutter and across the sacral promontory.  We identified the  left ureter and there was no injury to the left ureter.  We irrigated  out the abdomen and pelvis at this point.   We then isolated the loop of small bowel that was abnormal.  We ran the  small bowel from the ligament of Treitz all the way down to the  ileocecal valve again.  We resected about 6 inches of small bowel,  dividing the small bowel proximally and distally with a GIA stapling  device.  Mesenteric vessels were divided with the LigaSure device.  An  anastomosis was created between the proximal and distal ends of the  small bowel with the GIA stapling device.  There was some  bleeding along  the staple lines and we took several other sutures of 3-0 Vicryl to  control this.  We then closed the defect in the bowel wall with the TA-  60 stapling device.  This provided a very secure closure.  Some  interrupted sutures of 3-0 Vicryl were placed in critical points of the  staple lines as well.  The mesentery of the small bowel was closed with  interrupted sutures of 3-0 Vicryl.   We then removed the appendix because it had been involved in the  inflammatory process and adhesions.  We divided the appendiceal  mesentery and appendiceal artery with the LigaSure device.  We  transected the base of the appendix with the TA-60 stapling device and  removed the appendix.  We oversewed the staple line with a running  inverting suture of 3-0 Vicryl to cover the staple line up.  This  provided a very secure closure of the cecum but did not compromise the  ileocecal valve at all.   At this point we changed instruments and gloves and suction devices and  cautery devices.  We irrigated out the abdomen and pelvis with about 5  liters of saline.  All bleeding was controlled with electrocautery or  with 3-0 Vicryl sutures.   We undermined the skin and subcutaneous tissue off of the fascia to  mobilize it so that we could close the fascia.  We placed 2 large sheets  of Seprafilm on top of the omentum to reduce adhesion formation in the  future.  The midline fascia was then closed with a running suture of  double-stranded #1 PDS.  We also used several interrupted sutures  of  single stranded #1 PDS.  The wound was irrigated with saline.  The skin  was closed with skin staples but we did this loosely, leaving gaps in  between, and placed Telfa wicks to promote drainage.  Clean bandages  were placed and the patient taken to the recovery room in stable  condition.  Estimated blood loss was about 400 mL.   COMPLICATIONS:  None.   Sponge, needle and instrument counts were  correct.      Angelia Mould. Derrell Lolling, M.D.  Electronically Signed     HMI/MEDQ  D:  11/07/2007  T:  11/08/2007  Job:  [id number]   cc:   Olene Craven, M.D.  Fax: [phone]   WGNFAO ZHY QMVH, M.D.  Fax: [phone]

## 2011-04-12 NOTE — Discharge Summary (Signed)
NAMEMarland Kitchen  Christopher Guerrero, Christopher Guerrero NO.:  [id number]   MEDICAL RECORD NO.:  [id number]          PATIENT TYPE:  INP   LOCATION:  1609                         FACILITY:  [facility]   PHYSICIAN:  Angelia Mould. Derrell Lolling, M.D.DATE OF BIRTH:  [birth year]   DATE OF ADMISSION:  11/07/2007  DATE OF DISCHARGE:  11/20/2007                               DISCHARGE SUMMARY   FINAL DIAGNOSES:  1. Enterocutaneous fistula.  2. Colocutaneous fistula.  3. Abdominal wall abscess.  4. Postoperative infected abdominal and pelvic hematomas.   OPERATIONS PERFORMED:  1. Exploratory laparotomy with resection of enterocutaneous fistula,      resection of small bowel, segmental left colon resection with      primary anastomosis, resection of abdominal wall including skin,      subcutaneous tissue, muscle, and fascia; appendectomy and complex      abdominal wall closure.  2. CT-guided percutaneous drainage of pelvic and abdominal hematoma.   The day of the first surgery which was a laparotomy was November 07, 2007.  The date of the percutaneous drainage was November 16, 2007.   HISTORY:  This is a 45 year old white man who underwent emergent sigmoid  colon resection and colostomy on September 09, 2006 for perforated  diverticulitis.  He recovered and underwent an elective colostomy  closure on December 15, 2006.  He had to be rehospitalized in March of  2008 with abdominal pain and was thought to have a microscopic leak  around the anastomosis but never had an abscess or obstruction and  improved.  He then became asymptomatic for 6 months but then came back  with an abdominal wall abscess at the umbilicus which ultimately proved  to be very low volume enterocutaneous fistula.  He was treated with  hyperalimentation and bowel rest as an outpatient, but the fistula  failed to close.  He was worked up with colonoscopy and CT scan, and  there was no evidence of Crohn's disease or obstruction.  I offered him  repeat  exploration for resection of his fistula in hopes of resolving  his problems, and he agreed to that.  He underwent a 2-day bowel prep at  home and was brought to hospital electively.   HOSPITAL COURSE:  On the day of admission, the patient was taken to the  operating room.  We explored him through a midline incision through his  old scar.  We found that he had a loop of small bowel and a loop of the  left colon densely adhered to the anterior abdominal wall around just to  the left of the umbilicus.  I did identify a couple fistula tracts with  chronic granulation tissue and small abscess with silk sutures in them.  I did a conservative small bowel resection and primary anastomosis and a  conservative left colon resection with primary anastomosis and resected  the abnormal areas of the abdominal wall with a complex closure.  The  appendix was caught up in the inflammatory process but did not appear to  be a primary problem, and I removed the appendix because of its  proximity to the inflammatory process.   Postoperatively, the patient did relatively well.  He recovered from his  ileus and regained bowel function and appetite.  His hemoglobin fell  about 3 points in the postop period, initially this was from unknown  cause.  He initially did well but then developed some fever, and a CT  scan showed a large fluid collection in the anterior abdominal space  just behind the rectus muscles and also showed a large fluid collection  in the pelvis.  Both of these were drained percutaneously.  He was  placed on antibiotics.  These appeared to be hematomas, which explained  his anemia, and the cultures grew E coli.  He felt much better within a  few days and was discharged home on November 20, 2007.   At that time, he was sent home with the drains in place.  He was given a  prescription for oral antibiotics.  He will get a CT scan on the  following Monday and follow up with me in the office on  November 26, 2007.      Angelia Mould. Derrell Lolling, M.D.  Electronically Signed     HMI/MEDQ  D:  12/09/2007  T:  12/10/2007  Job:  [id number]   cc:   Olene Craven, M.D.  Fax: [phone]   JXBJYN WGN FAOZ, M.D.  Fax: [phone]

## 2011-04-12 NOTE — H&P (Signed)
NAME:  Christopher Guerrero, WOOLEVER NO.:  [id number]   MEDICAL RECORD NO.:  [id number]         PATIENT TYPE:  LINP   LOCATION:                               FACILITY:  [facility]   PHYSICIAN:  Angelia Mould. Derrell Lolling, M.D.DATE OF BIRTH:  January 18, 1966   DATE OF ADMISSION:  09/06/2007  DATE OF DISCHARGE:                              HISTORY & PHYSICAL   CHIEF COMPLAINT:  Abdominal pain and enterocutaneous fistula.   HISTORY OF PRESENT ILLNESS:  This is a 45 year old white male who is  being admitted for evaluation and management of a recently diagnosed  enterocutaneous fistula.   His history goes back to September 09, 2006 at which time he was operated  upon emergently for perforated diverticulitis and underwent sigmoid  colon resection and colostomy.  He recovered uneventfully and underwent  elective colostomy closure on December 15, 2006.  That was done through a  left lower quadrant incision with a hand-sewn anastomoses.  He did well  initially but had to be rehospitalized in March of 2008 with abdominal  pain and CT findings of thickening of the colon around the anastomosis  just behind the left rectus sheath and some air bubbles in the area, but  there was no abscess.  He was treated with hyperalimentation, bowel rest  and antibiotics and after about 10 days recovered and became  asymptomatic, returned to normal activities and has had no symptoms for  6 months.   He presented to the office about 10-14 days ago with a wound abscess  which was drained in the office.  A  little bit of air came out of this  and some tan, creamy, nonfoul pus was cultured.  Gram stain showed gram  positive cocci in pairs, but the culture showed multiple species.  He  felt better for a few days but then developed increasing pain in his  lower abdomen and left lower quadrant.  A CT scan was obtained, and that  shows some persistent thickening of the left colon just below the left  rectus sheath similar to what  it looked like before.  There was also  question of a small bowel fistula to the midline.  There was no  obstruction.  There was no free air.  There was an air pocket between  the colon and posterior rectus sheath but no abscess or obstruction.  He  has been on Augmentin for the past 7 days.  He has had no fever or  chills.   We sent him for a fistulogram yesterday.  The fistulogram shows a small  tiny tract extending from the periumbilical wound to the left and  posteriorly and into a loop of small bowel, although this was a very  slow trickle.  He continues to have very low output from the wound.  He  has had a couple of bubbles of air, has had some brown, foul-smelling  fluid.  He says he has lost about 8 pounds and is not as hungry but no  nausea or vomiting.  No fever or chills.   He is being admitted for further  evaluation and management of his  enterocutaneous fistula.   PAST HISTORY:  1. Two-stage resection for diverticulitis.  2. Hospitalization for presumed microscopic anastomotic leak March of      2008.  3. History of mild gastroesophageal reflux disease.  4. History of prostatitis.   CURRENT MEDICATIONS:  Augmentin.  Prevacid.  Vicodin.   DRUG ALLERGIES:  None known.   SOCIAL HISTORY:  He is married, lives in [place] with his wife and  one child.  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  Denies use of tobacco except for  an occasional cigar.  Drinks alcohol occasionally.   FAMILY HISTORY:  Mother living, has diabetes and hypertension.  Father  deceased, had pulmonary fibrosis, coronary artery disease and kidney  stones.   REVIEW OF SYSTEMS:  A 15-system review of systems is performed and is  noncontributory except as described above.   PHYSICAL EXAMINATION:  Pleasant, cooperative gentleman who appears  somewhat fatigued but in no acute distress.  Does not appear toxic.  Temperature 98, pulse 82, blood  pressure 123/77, weight 195.  EYES:  Sclerae clear.  Extraocular movements intact.  Ears, mouth and  throat, nose, tongue and oropharynx without gross lesions.  NECK:  Supple, nontender.  No mass.  No jugular distention.  LUNGS:  Clear to auscultation.  No chest wall tenderness.  HEART:  Regular rate and rhythm.  No murmurs.  Radial, femoral and  dorsalis pedis pulses are palpable.  No peripheral edema.  ABDOMEN:  Abdomen is soft with positive bowel sounds.  There is an open  wound around the umbilicus which is actually much smaller than it was  before.  A little bit of tan, creamy pus present.  No small-bowel  content noted.  No cellulitis or erythema.  There is some tenderness and  thickening in the left lower quadrant below his old colostomy site.  That scar is intact.  GENITOURINARY:  Penis, scrotum, testes are normal.  EXTREMITIES:  Moves all four extremities well without pain or deformity.  NEUROLOGIC:  No gross motor or sensory deficits.   ASSESSMENT:  1. Low output enterocutaneous fistula.  Question whether this is due      to foreign body consistent with the Prolene sutures we have removed      or whether there is something else going on such as partial      obstruction or neoplasia or untreated infection.  2. Left colon inflammatory mass, etiology unclear.  Presumably      technical problem at the anastomosis.  3. Status post Hartmann resection for perforated diverticulitis,      September 09, 2006.  4. Status post colostomy closure December 15, 2006.  5. Status post hospitalization March 2008 for presumed microscopic      anastomotic leak, fully recovered and asymptomatic for 6 months.  6. Gastroesophageal reflux disease.  7. Remote history of prostatitis.   PLAN:  1. The patient will be admitted to hospital for IV fluids, initiation      of hyperalimentation and bowel rest.  2. He will be started on IV Cipro and Flagyl to help control any      infection around the  fistula.  3. He will need an extensive workup to define his anatomy and to      further try to answer the question as to whether there is further      foreign body present, whether there is untreated infection, whether  this neoplasia, obstruction,      or a fistula between the colon and the small bowel. I am going to      asked Dr. Anselmo Rod to see him at some point to get her      opinion regarding these GI issues and as to whether he might need a      colonoscopy.  4. Will get a Gastrografin enema on hospital day #2.      Angelia Mould. Derrell Lolling, M.D.  Electronically Signed     HMI/MEDQ  D:  09/05/2007  T:  09/06/2007  Job:  [id number]   cc:   Olene Craven, M.D.  Fax: [phone]   JXBJYN WGN FAOZ, M.D.  Fax: [phone]

## 2011-04-12 NOTE — Op Note (Signed)
NAME:  Christopher Guerrero, Christopher Guerrero NO.:  [id number]   MEDICAL RECORD NO.:  [id number]          PATIENT TYPE:  AMB   LOCATION:                                 FACILITY:   PHYSICIAN:  Angelia Mould. Derrell Lolling, M.D.DATE OF BIRTH:  [birth year]   DATE OF PROCEDURE:  02/16/2008  DATE OF DISCHARGE:                               OPERATIVE REPORT   PREOPERATIVE DIAGNOSIS:  Chronic, recurring abdominal wound infection.   POSTOPERATIVE DIAGNOSIS:  Chronic, recurring abdominal wound infection,  question of foreign body reaction and suture granuloma.   OPERATION PERFORMED:  Debridement of skin, subcutaneous tissue and  muscle of abdominal wall.   SURGEON:  Angelia Mould. Derrell Lolling, M.D.   OPERATIVE INDICATIONS:  This is a 45 year old white man who has a  history of diverticulitis necessitating emergent sigmoid colon resection  and colostomy in 2007.  He underwent elective colostomy closure in 2008.  Following that he had to be hospitalized with abdominal pain and a  possible microscopic leak.  He recovered from that and then 6 months  later developed an abscess at the umbilicus which proved to be a low  volume enterocutaneous fistula.  This would not close with conservative  therapy and he was operated upon in December of 2008 at which time he  underwent laparotomy, resection of enterocutaneous fistula with small  bowel resection, resection of colocutaneous fistula with colon resection  and debridement of abdominal wall.  He recovered from that surgery.  He  has developed recurring episodes of pain, swelling and redness of his  periumbilical skin.  CT scan showed thickening of the tissues but no  abscess.  He is otherwise well and has returned to work.  Despite  multiple courses of antibiotics this continues to recur and he is  brought to the operating room for exploration and debridement of the  abdominal wall wound.   PROCEDURE IN DETAIL:  Following the induction of general endotracheal  anesthesia the patient's abdomen was prepped and draped in a sterile  fashion.  The patient was identified as the correct patient and correct  procedure.  Intravenous antibiotics were given.  Using a marking pen I  marked off a vertically oriented elliptical incision which encompassed  the very thickened chronically reddened tissue around the umbilicus.  This encompassed the umbilicus.  The incision was probably about 5 or 6  cm vertically by about 4 cm transversely.  Before making the incision I  used an 18 gauge needle and aspirated a little bit of bloody fluid from  the deeper subcutaneous tissues and sent that for culture.  I then made  a vertically oriented elliptical incision.  Dissection was carried down  through the subcutaneous tissues.  In the very deep subcutaneous tissue  approaching the fascia I encountered chronically infected space with  some slimy tissue suggesting suture granuloma and foreign body reaction.  There were some deteriorating PDS sutures and knots in this area which I  debrided away.  I debrided away a fair amount of hard woody tissue  around the fascia.  There was no evidence of any enteric  drainage.  There was no evidence of any fistula or abscess drainage.  Once I had  debrided all of these tissues the wound was clean and had no drainage.  Hemostasis was excellent and achieved with electrocautery.  The wound  was irrigated with saline.   It should be mentioned that once I entered the chronically infected  space I did take anaerobic and aerobic cultures.   After hemostasis was achieved and the wound was irrigated I packed the  wound with saline moistened 4x4 gauze and covered with a bulky absorbent  bandage.  The patient tolerated the procedure well and was taken to the  recovery room in stable condition.   ESTIMATED BLOOD LOSS:  Was about 25 mL.   COMPLICATIONS:  None.   COUNTS:  Sponge, needle and instrument counts were correct.      Angelia Mould.  Derrell Lolling, M.D.  Electronically Signed     HMI/MEDQ  D:  02/16/2008  T:  02/16/2008  Job:  [id number]

## 2011-04-14 ENCOUNTER — Other Ambulatory Visit: Payer: Self-pay | Admitting: Internal Medicine

## 2011-04-14 NOTE — Telephone Encounter (Signed)
Rx Done . 

## 2011-04-15 NOTE — H&P (Signed)
NAME:  BROUGHTON, EPPINGER NO.:  [id number]   MEDICAL RECORD NO.:  [id number]          PATIENT TYPE:  EMS   LOCATION:  ED                           FACILITY:  [facility]   PHYSICIAN:  Angelia Mould. Derrell Lolling, M.D.DATE OF BIRTH:  23-Jul-1966   DATE OF ADMISSION:  09/08/2006  DATE OF DISCHARGE:                                HISTORY & PHYSICAL   CHIEF COMPLAINT:  Lower abdominal pain and vomiting.   HISTORY OF PRESENT ILLNESS:  This is a 45 year old white man who has a  history of diverticulitis in the past.  A month and half ago, he had lower  abdominal pain mostly left lower quadrant and some vomiting.  He saw Dr.  Kern Reap.  According to the patient, he had a CT scan which confirmed  diverticulitis and he was placed on oral Cipro and he responded and  recovered and became asymptomatic.   Yesterday, September 07, 2006, he developed left lower quadrant pain and  nausea and vomiting and a fever of 99.  He  saw his physician and was placed  on p.o. Flagyl and Cipro.  He has had very minimal bowel movements since  that time.  Today, he saw Dr. Laverle Patter in the [facility].  He had a  testicular ultrasound was told that he did not seem to have prostatitis or  epididymitis or a urinary tract infection and that most likely this was  diverticulitis.  The patient's pain became suddenly worse later on this  afternoon and it spread across the entire lower abdomen and he came to the  [facility] for evaluation.   He was evaluated by the Emergency Department physician.  A CT scan was  obtained which shows sigmoid diverticulitis with inflammatory changes around  the sigmoid colon.  There is a small pocket of air around the sigmoid colon.  There are a few tiny slivers of air elsewhere in the abdomen, not a very  large volume of air.  There is no free fluid or abscess and there are no  obvious signs of any obstruction by CT.  His abdominal films were  unremarkable.  I was  called to see him.   He states that pain medicine helped him for a while but he is developing a  recurrent lower abdominal pain and is still quite uncomfortable.   PAST HISTORY:  He has been told he had prostatitis by Dr. Brunilda Payor in the past.  He has some reflux symptoms.  He has had a vasectomy in the past.  Otherwise, no medical or surgical problems.   CURRENT MEDICATIONS:  1. Prevacid  2. Cipro  3. Flagyl.   DRUG ALLERGIES:  None known.   SOCIAL HISTORY:  The patient is married and lives in [place] with his  wife and one child.  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.  He denies the use of tobacco, except an occasional cigar.  He  drinks alcohol occasionally.   FAMILY HISTORY:  Mother living and has diabetes and hypertension.  Father  deceased, he had pulmonary fibrosis,  coronary artery disease,  and kidney  stones.   REVIEW OF SYSTEMS:  A 15-system review of systems is performed and is  noncontributory except as described above.   PHYSICAL EXAMINATION:  GENERAL:  A pleasant gentleman who looks a little bit  older than his age with graying hair.  He is alert and cooperative but  uncomfortable.  VITAL SIGNS:  Temperature originally was 98.0, currently 103.  Heart rate is  about 98.  Blood pressure 147/83.  Respirations 22.  Oxygen saturation 99%  on room air.  HEENT:  Eyes:  Sclerae are clear.  Extraocular movements are intact.  Ears,  nose, mouth, and throat:  Nose, lips, tongue,  and oropharynx are without  gross lesions.  Neck: Supple, nontender.  No mass.  No crepitus.  No jugular  venous distention.  LUNGS:  Clear to auscultation.  No chest wall tenderness.  HEART:  Regular rate and rhythm.  Borderline tachycardia.  No murmurs.  Radial, femoral, and dorsalis pedis pulses are palpable.  No peripheral  edema.  ABDOMEN: The abdomen has active bowel sounds.  It is soft with minimal  tenderness in the upper abdomen but he is a little bit tender  across the  upper abdomen.  The lower abdomen is extremely tender with involuntary  guarding throughout.  He is too tender to appreciate a mass.  I do not feel  any hernias.  There is no skin change.  GENITOURINARY:  Penis, scrotum, and testes look normal.  There is no  inguinal mass.  EXTREMITIES:  He moves all four extremities well without pain or deformity  although it does hurt his abdomen when he moves.  NEUROLOGIC:  No gross motor or sensory deficits.   ADMISSION DATA:  CT scan as described above.  Hemoglobin 15.8, white blood  cell count 19,700, prothrombin time 14.9.  Urinalysis grossly normal.  Complete metabolic panel shows a glucose of 142, lipase is 15.   ASSESSMENT:  Acute diverticulitis with microperforation.  His CT scan does  not look too bad for a contained microperforation, although he does have a  significant amount of lower abdominal tenderness and I am concerned about  his degree of fever.   PLAN:  1. The patient will be admitted to the hospital and started on broad      spectrum antibiotics.  2. If he responds promptly to conservative therapy with bowel rest and      hydration and antibiotics, then we may be able to escape a colectomy      with colostomy.  I told the patient and his wife that if his pain and      fever do not come under control, that he may require a colectomy with      colostomy during this hospitalization.  He will be observed carefully      for any deterioration.      Angelia Mould. Derrell Lolling, M.D.  Electronically Signed     HMI/MEDQ  D:  09/08/2006  T:  09/10/2006  Job:  [id number]   cc:   Olene Craven, M.D.  Fax: [phone]

## 2011-04-15 NOTE — Discharge Summary (Signed)
NAME:  Christopher Guerrero, Christopher Guerrero NO.:  [id number]   MEDICAL RECORD NO.:  [id number]          PATIENT TYPE:  INP   LOCATION:  1616                         FACILITY:  [facility]   PHYSICIAN:  Angelia Mould. Derrell Lolling, M.D.DATE OF BIRTH:  Mar 22, 1966   DATE OF ADMISSION:  12/15/2006  DATE OF DISCHARGE:  12/22/2006                               DISCHARGE SUMMARY   FINAL DIAGNOSIS:  Diverticulitis, status post colon resection with  colostomy.   OPERATION PERFORMED:  Resection and closure of colostomy.   DATE OF SURGERY:  December 15, 2006.   HISTORY:  This is a 45 year old white man who underwent emergent sigmoid  colectomy and colostomy for perforated sigmoid diverticulitis on September 09, 2006.  He recovered uneventfully.  He wants his colostomy closed.  He had a barium enema on November 13, 2006 which showed minimal  diverticula of the mid descending colon, but no other lesions seen.  He  underwent flexible sigmoidoscopy as an outpatient to about 25 cm; and it  looked healthy.  He underwent a bowel prep at home; and was brought to  hospital electively.   HOSPITAL COURSE:  On the day of admission, the patient was taken to the  operating room.  Through a left lower quadrant incision.  We mobilized  and resected his colostomy; and also were able to mobilize the distal  segment of colon and resect that, somewhat; and then performed an end-to-  end anastomosis.  The surgery was uneventful.  Postoperatively the  patient had an ileus for 2 or 3 days.  Foley catheter was removed on  postoperative day #2.  A liquid diet was started on postoperative day  #4, and advanced slowly and steadily thereafter.  He was able to be  discharged on December 22, 2006.  At that time he was having good bowel  movements felt well, was afebrile; the abdomen was soft, benign  appearing; and the wound was healing uneventfully.  Staples were  removed.  He was given a prescription for Vicodin for pain and asked to  return to see me in office in 2-3 weeks.      Angelia Mould. Derrell Lolling, M.D.  Electronically Signed     HMI/MEDQ  D:  01/16/2007  T:  01/16/2007  Job:  [id number]   cc:   Olene Craven, M.D.  Fax: [phone]

## 2011-04-15 NOTE — H&P (Signed)
NAMEMarland Kitchen  Christopher Guerrero, Christopher Guerrero NO.:  [id number]   MEDICAL RECORD NO.:  [id number]          PATIENT TYPE:  INP   LOCATION:  2621                         FACILITY:  [facility]   PHYSICIAN:  Della Goo, M.D. DATE OF BIRTH:  1966/08/17   DATE OF ADMISSION:  02/02/2007  DATE OF DISCHARGE:                              HISTORY & PHYSICAL   PRIMARY CARE PHYSICIAN:  Olene Craven, M.D.   SURGEON:  Angelia Mould. Derrell Lolling, M.D.   CHIEF COMPLAINT:  Abdominal pain, fevers, chills.   HISTORY OF PRESENT ILLNESS:  This is a 45 year old male who has a  history of diverticulosis and diverticulitis who reports having  abdominal pain, fevers, chills since his surgery on December 15, 2006,  where his colostomy had been taken down and re-anastomosed.  The patient  reports having fevers, chills, as well as continuing to lose weight.  The patient reports having a weight loss of 8 pounds over the past week.  The patient reported to his primary care physician who scheduled a CT  scan as an outpatient, the results of which were called in and revealed  a diverticulitis and a perforation at the anastomotic site.  The patient  was told to report immediately to the hospital for admission and  evaluation by surgery.   The patient reports having an initial diverticulitis with perforation in  October 2007, where he underwent surgery and had colostomy placement.  The patient reports having lower abdominal pain, especially at the left  lower quadrant area that was described as being a stabbing pain.   PAST MEDICAL HISTORY:  1. As mentioned above.  2. The patient also has a history of gastroesophageal reflux disease.  3. Previously had a case of prostatitis.   MEDICATIONS:  Include Prevacid.   ALLERGIES:  NO KNOWN DRUG ALLERGIES.   PAST SURGICAL HISTORY:  Mentioned above.   SOCIAL HISTORY:  Patient is married, reports rarely smoking cigars, and  having rare alcohol.   FAMILY HISTORY:  The  patient's mother has diabetes, type 2 and  hypertension.  His father is deceased of pulmonary fibrosis.   REVIEW OF SYSTEMS:  Pertinent factors mentioned above.  The patient has  also had nausea, decreased p.o. intake.  He denies having shortness of  breath, chest pain.  He does have weakness, but denies dizziness,  syncope, and myalgias.   PHYSICAL EXAMINATION FINDINGS:  GENERAL:  This is a 45 year old, well-  nourished, well-developed male in discomfort but no acute distress  currently.  VITAL SIGNS:  Temperature 98.1, blood pressure 125/82, heart rate 92,  respirations 20, O2 saturation 96% on room air.  HEENT:  Normocephalic, atraumatic.  Pupils equally round, reactive to  light.  There is no scleral icterus.  Funduscopic benign.  Extraocular  muscles are intact.  Oropharynx is clear.  NECK:  Supple.  Full range of motion.  No thyromegaly, adenopathy or  jugular venous distension.  CARDIOVASCULAR:  Regular rate and rhythm.  No murmurs, gallops or rubs.  LUNGS:  Clear to auscultation bilaterally.  ABDOMEN:  Positive bowel sounds.  Normoactive.  There are two  linear  surgical scars that are paramedian to the umbilicus.  There is  tenderness around the left most scar.  There is no rebound or guarding  at this time.  However, the patient has been medicated with IV pain  medication.  RECTAL:  Deferred.  NEUROLOGIC:  The patient is alert and oriented x3.  Cranial nerves are  intact.  There are no motor or sensory deficits.  He does have mild  generalized weakness.   LABORATORY STUDIES:  Reveal a white blood cell count of 9.1, hemoglobin  13.5, hematocrit 39.5, MCV 91.4, platelets 326, neutrophils 64%,  lymphocytes 29%.  Urinalysis:  Negative.  Sodium 136, potassium 3.6,  chloride 104, carbon dioxide 23, BUN 10, creatinine 0.9, glucose 113,  calcium 9.3, albumin 3.5, AST 16, ALT 8.  Results of the CT scan of the  abdomen are reported above.   ASSESSMENT:  1. This is a 45 year old  male being admitted with diverticulitis and a      perforation at the anastomotic site of his colostomy takedown.  2. Abdominal pain.  3. Gastroesophageal reflux disease.  4. Mild dehydration.  5. Fever.   PLAN:  The patient will be made n.p.o. and placed on IV fluids for re-  hydration therapy.  IV antibiotic therapy of Cipro and Flagyl have been  ordered.  General surgery has been consulted has been consulted and is  currently seeing the patient.  Further plans for therapy will ensue.  The patient has not been placed on DVT prophylaxis at this time  secondary to possible surgical intervention.  IV Protonix has been  ordered q.12 h.      Della Goo, M.D.  Electronically Signed     HJ/MEDQ  D:  02/03/2007  T:  02/03/2007  Job:  [id number]   cc:   Olene Craven, M.D.  Angelia Mould. Derrell Lolling, M.D.

## 2011-04-15 NOTE — Discharge Summary (Signed)
NAME:  Christopher Guerrero, Christopher Guerrero NO.:  [id number]   MEDICAL RECORD NO.:  [id number]          PATIENT TYPE:  INP   LOCATION:  1308                         FACILITY:  [facility]   PHYSICIAN:  Angelia Mould. Derrell Lolling, M.D.DATE OF BIRTH:  [birth year]   DATE OF ADMISSION:  09/08/2006  DATE OF DISCHARGE:  09/17/2006                                 DISCHARGE SUMMARY   FINAL DIAGNOSIS:  Perforated sigmoid diverticulitis.   OPERATIONS PERFORMED:  Sigmoid colon resection with colostomy Gertie Gowda  resection).   HISTORY:  This is a 45 year old white man who has a history of  diverticulitis in the past, was treated medically, and according to patient  had a CT scan which confirmed diverticulitis, and he became asymptomatic.  He presented at this time a 24-hour history of left lower quadrant pain,  nausea and vomiting.  On evaluation in the emergency department, a CT scan  was obtained which showed sigmoid diverticulitis with inflammatory changes  around the sigmoid colon and small pocket of air around the sigmoid colon a  few tiny bubbles of air elsewhere in the abdomen but not a large volume of  air, no free fluid, no abscess, no sign of any extravasation of GI contrast.  He was admitted for further evaluation and management.   PHYSICAL EXAMINATION:  GENERAL:  A pleasant gentleman, obviously  uncomfortable but alert and cooperative.  Temperature was 98.0 on admission  and then became 103 in the emergency room.  Heart rate 98, blood pressure  147/83, respirations 22, oxygen saturation 99% on room air.   Significant physical findings were limited to the abdomen which had active  bowel sounds, soft with minimal tenderness of the upper abdomen.  The lower  abdomen was very tender with involuntary guarding throughout.  No mass  noted.  No hernia.   ADMISSION DATA:  Hemoglobin 15.8, white blood cell count 19,700.   HOSPITAL COURSE:  The patient was admitted and placed on broad-spectrum  antibiotics.  I felt that he had a reasonable chance of medical management,  although I was not sure.  Certainly, he had no evidence of any massive  perforation or abscess.   The patient was evaluated several hours later in the early morning hours of  September 09, 2006, and he stated he was feeling better.  His temperature was  98.8.  His heart rate was down to 69.  His abdomen was still fairly tender,  especially in the left lower quadrant but also tender in the right lower  quadrant.  His white count had come down to 12,100.  We chose to observe  him.  About 1 hour later, he noted a sudden worsening of his pain and was  vomiting, and I felt that he was still fairly tender, and I did not think  that we could continue to treat him non-operatively.   He was taken to operating room in the morning of September 09, 2006.  I found  he had a focal area of diverticulitis and sigmoid colon with perforation,  but there really was not much fecal contamination, just a drop or  two in the  left paracolic gutter and a little bit of inflammation.  The sigmoid colon  resection was performed.  The abdomen was lavage extensively.  An end-  descending colostomy was performed.  The surgery went well.  Postoperatively  the patient slowly but progressively improved without complication.  He had  fevers for a couple days, but they ultimately resolved.  His stoma had a  little bit of venous congestion early on, but then pinked up and began  working after a few days.  Abdominal wound had Telfa wicks in it for several  days and we did remove them, and the wound looked good.  After about 4 or 5  days, we began liquid diet and advanced that and his activity slowly but  steadily.  He progressed his diet and activities and had no problems  whatsoever.   Was discharged on September 17, 2006, tolerating a diet.  White blood cell  count was down to 6,000.  The incision looked good, and the colostomy was  healthy.  The  colostomy was working good.  He had been taught colostomy care  and how change the bags.  Visiting nurses were arranged for home care.  Follow-up in the office was arranged.      Angelia Mould. Derrell Lolling, M.D.  Electronically Signed     HMI/MEDQ  D:  10/02/2006  T:  10/02/2006  Job:  [id number]   cc:   Olene Craven, M.D.  Fax: [phone]

## 2011-04-15 NOTE — Op Note (Signed)
NAME:  Christopher Guerrero, Christopher Guerrero NO.:  [id number]   MEDICAL RECORD NO.:  [id number]          PATIENT TYPE:  INP   LOCATION:  X006                         FACILITY:  [facility]   PHYSICIAN:  Angelia Mould. Derrell Lolling, M.D.DATE OF BIRTH:  [birth year]   DATE OF PROCEDURE:  12/15/2006  DATE OF DISCHARGE:                               OPERATIVE REPORT   PREOPERATIVE DIAGNOSIS:  Diverticulitis with colostomy.   POSTOPERATIVE DIAGNOSIS:  Diverticulitis with colostomy.   OPERATION PERFORMED:  Resection and closure of colostomy.   SURGEON:  Angelia Mould. Derrell Lolling, M.D.   ASSISTANT:  Dr. Lebron Conners   OPERATIVE INDICATIONS:  This is a 45 year old white man who presented  with perforated sigmoid diverticulitis on September 09, 2006.  He  underwent short segment resection and end colostomy.  He had a fairly  lengthy distal segment.  He has been studied as an outpatient with a  barium enema and flexible sigmoidoscopy and he has almost no residual  diverticula.  The ends of the colostomy and the rectal stump are very  close to each other anatomically.  He has undergone a bowel prep at  home.  He is brought to operating room electively.   OPERATIVE TECHNIQUE:  Following the induction of general endotracheal  anesthesia the patient was identified as to correct patient and correct  procedure.  Foley catheter was inserted.  A pursestring suture of 2-0  silk was placed in the colostomy to occlude it.  The abdomen was then  prepped and draped in sterile fashion.  Intravenous antibiotics were  given prior to the incision.  A vertically oriented elliptical incision  was made around the colostomy.  Ultimately we extended this incision  inferiorly to accomplish the anastomosis.  Then dissection was carried  down through subcutaneous tissue using electrocautery and scissor  dissection and dissecting the colon and its mesentery away from the  surrounding soft tissue and the abdominal wall muscles.  We gained  entry  into the peritoneal space and slowly took all the adhesions down  completely mobilizing the proximal colon.  There were some loops of  small bowel that were easily visualized and we took adhesions down there  to pack them away.  We extended the skin and fascial incision inferiorly  and placed a retractor and we could see the Prolene sutures on the  proximal rectal stump.  We pulled this up and took some time taking some  adhesions down from lateral pelvic sidewall and we also had a couple of  loops of small bowel that we were able to easily dissect away.  The  small bowel looked fine after the dissection.  We could see the proximal  rectum coming directly up into the wound and felt that we could do the  anastomosis into this left paramedian incision.  The proximal rectal  stump was held in place with Allis clamps and cleaned off a little bit  of fatty tissue around it and then amputated the stapled end.  The colon  there was pink and healthy and bled quite well.   We then turned our attention  to the colostomy.  We amputated about 4  inches of the proximal colon with the colostomy.  We divided mesenteric  vessels between clamps and ligated this with 2-0 silk ties.  We  amputated this with a knife and likewise we had excellent bowel prep and  excellent vigorous blood supply.   Anastomosis was created with a single layer interrupted sutures of 2-0  silk.  Inverting corner sutures of 2-0 silk were placed.  Then a simple  suture of 2-0 silk was placed in the back wall.  The back wall suture  line was interrupted sutures of 2-0 silk.  We were very careful to take  big bites to make sure we incorporated fairly significant amount of wall  of the proximal and distal segments colon.  After all the sutures were  placed they were carefully tied.  A couple of extra sutures were placed  to be sure that we had this completely closed down and then we cut all  the sutures leaving the corner  sutures in place.  We turned in the  corners with three or four interrupted inverting sutures of 2-0 silk.  This turned the corners in quite nicely.  Anteriorly we placed the three  or four interrupted simple sutures of 2-0 silk to complete the  anastomosis.  Technically we could see very well.  The colon looked pink  and healthy.  We could palpate a single finger anastomosis which was as  big as the colon was proximally and distally.  There had been no  contamination.  We irrigated out the wound as well as the left lower  quadrant of the abdomen until irrigation fluid was clear.  There was no  sign of any active bleeding.  We checked the small bowel area and it  looked fine.  We placed Tisseel on the anastomosis and let that harden.  We then closed the wound in three layers.  The remnant of the posterior  rectus sheath was closed with a running suture of 2-0 Vicryl.  The  anterior rectus sheath was closed with interrupted sutures of #1 Novofil  and skin closed with skin staples with a few Telfa wicks in between  staples to promote drainage.  Clean bandages placed. The patient taken  recovery room in stable condition.  Estimated blood loss was about 60-75  mL.  Complications none. Sponge, needle, and instrument counts were  correct.      Angelia Mould. Derrell Lolling, M.D.  Electronically Signed     HMI/MEDQ  D:  12/15/2006  T:  12/15/2006  Job:  [id number]   cc:   Olene Craven, M.D.  Fax: [phone]

## 2011-04-15 NOTE — Consult Note (Signed)
NAME:  Christopher Guerrero, Christopher Guerrero NO.:  [id number]   MEDICAL RECORD NO.:  [id number]          PATIENT TYPE:  INP   LOCATION:  2621                         FACILITY:  [facility]   PHYSICIAN:  Cherylynn Ridges, M.D.    DATE OF BIRTH:  [birth year]   DATE OF CONSULTATION:  DATE OF DISCHARGE:                                 CONSULTATION   ADDRESS:  Della Goo, MD  [address] [place], [place] [address]   BODY:  Dear Dr. Lovell Sheehan:   Thank you for asking [place] to see [name], a gentleman who is well known  to our service as he is a patient of Dr. Claud Kelp.  He had  previously undergone a colectomy and colostomy back in October 2007.  He  underwent a takedown of colostomy back in January on the 18th and has  never really fully recovered from what the patient says.  This was done  at [facility].  He was discharged after about a week in the  hospital and has never really gotten back to quite himself.  Recently  over the past week he has had intermittent fevers up to 101 and although  he was seen in the office plain films demonstrated no evidence of free  air or obstruction and he was discharged from surgery having being  discharged back to work.  However, he continued to have pain and had to  be sent home from work.  A CT scan was done which demonstrates a lot of  perianastomotic inflammation, some stranding and some air which seems to  have bubbled into the area of the previous colostomy.  No extravasation  of contrast was noted although the contrast had not made it down to that  site.   His white cell count is only 9.2 thousand.  His hemoglobin is normal.  He has no left shift.  On exam he does have tenderness and fullness  around the stomal site.  He has got no pneumaturia.  He has good bowel  sounds.  His appetite has been normal.  Although his appetite has been  normal, he has lost weight over the last several weeks since his  takedown colostomy.   IMPRESSION:  My concern is that he has an anastomotic leak although the  CT scan findings are consistent with that or maybe a microperforation  from a separate area of diverticulitis.  It is all near the anastomosis.  The patient needs to be admitted for IV antibiotics and possibly a  percutaneous drainage if he should develop an abscess but now he does  not have a definite abscess and we could create a fistula with a  percutaneous drain.  IV antibiotics alone may help subside his problems;  however, we will keep him nothing by mouth just in case he requires  percutaneous drainage and/or operative intervention which could lead to  another temporary colostomy.  I did explain this to the patient's family  and also we will leave a message with Dr. Derrell Lolling.      Cherylynn Ridges, M.D.  Electronically Signed    JOW/MEDQ  D:  02/02/2007  T:  02/03/2007  Job:  [id number]   cc:   Della Goo, M.D.

## 2011-04-15 NOTE — Op Note (Signed)
NAME:  Christopher Guerrero, Christopher Guerrero NO.:  [id number]   MEDICAL RECORD NO.:  [id number]          PATIENT TYPE:  INP   LOCATION:  1308                         FACILITY:  [facility]   PHYSICIAN:  Angelia Mould. Derrell Lolling, M.D.DATE OF BIRTH:  [birth year]   DATE OF PROCEDURE:  09/09/2006  DATE OF DISCHARGE:                                 OPERATIVE REPORT   PREOPERATIVE DIAGNOSIS:  Perforated sigmoid colon diverticulitis.   POSTOPERATIVE DIAGNOSIS:  Perforated sigmoid colon diverticulitis.   OPERATION PERFORMED:  Exploratory laparotomy, sigmoid colon resection,  proximal sigmoid colostomy.   SURGEON:  Angelia Mould. Derrell Lolling, M.D.   FIRST ASSISTANT:  Dr. Lebron Conners.   OPERATIVE INDICATIONS:  This is a 45 year old white man who has had an  episode of acute diverticulitis about 6-8 weeks ago which resolved.  He  developed another episode beginning night before last and then yesterday  developed sudden severe pain and was evaluated in the emergency room.  He  was found a very tender in the lower abdomen and by CT scan had a few tiny  air bubbles throughout the abdomen but no free fluid or extravasation of  contrast.  He was admitted and placed on broad spectrum antibiotics.  He  actually felt better somewhat this morning but then later in the morning,  developed a sudden worsening of his pain and vomiting and seems to be more  tender.  I felt that he should be operated upon.   OPERATIVE FINDINGS:  The patient had a focal area of diverticulitis in his  midsigmoid colon with perforation there.  There was a little bit of fecal  contamination in the lateral paracolic gutter but this was quite localized.  There really was very little peritonitis other than in the pelvis around the  sigmoid colon.  Small bowel looked fairly normal.  The right colon looked  normal.  I did not feel any other masses in the abdomen.   OPERATIVE TECHNIQUE:  Following induction of general endotracheal  anesthesia, a  nasogastric tube and a Foley catheter were placed.  The  abdomen was prepped and draped in sterile fashion.  Lower midline laparotomy  was performed.  I found that he had a hernia around this umbilicus and so I  went up and around the umbilicus to the right and debrided the hernia  contents which was a lot of preperitoneal fat.  I continued the incision  just barely to the top of the umbilicus until the fascia was strong again.  I undermined the skin around this a little bit to facilitate closure.  The  abdomen was explored with findings as described above.  I mobilized the  sigmoid colon by dividing its lateral peritoneal attachments and bring it  toward the midline of the wound.  The sigmoid colon proximally was  transected about an inch above the area of inflammation using a GIA stapling  device.  Mesenteric vessels were isolated and divided using the LigaSure  device.  I kept the mesenteric dissection close to the colon to stay out of  the retroperitoneum.  Distally I transected the proximal  rectum with a GIA  stapling device.  The specimen was removed.   I then irrigated the abdomen and pelvis with about 5 or 6 liters of saline.  The irrigation fluid became quite clear.  I checked around for bleeding and  there was none.  I have fairly good a long rectal stump above the peritoneal  reflection and tacked this to the left lateral pelvic sidewall with two  interrupted sutures of 2-0 Prolene to facilitate future colostomy closure.   I further mobilized the proximal sigmoid colon and descending colon by  dividing the peritoneal attachments.  I had to take down a little bit of the  mesentery using the LigaSure.  I then found that I could bring it through  the abdominal wall.  I brought the colostomy through the abdominal wall and  about the level of the umbilicus, perhaps slightly below this.  A circular  button of skin was excised.  The subcutaneous fat was debrided away.  The  anterior  rectus sheath was incised in a cruciate fashion.  The rectus muscle  was retracted medially and laterally.  The posterior rectus sheath was  incised generously with the cautery and the stoma site was dilated until  easily could pass two fingers through this.  Using a Babcock clamp I brought  the colon up through this wound up where it came up reasonably well although  I did not have an excessive amount of colon.  It did come up reasonably well  and was viable.   We finished irrigating the abdomen, checked for bleeding and there was none.  The omentum returned to its anatomic positions.  All laparotomy pads were  removed.  The midline fascia was closed with running suture of #1 PDS except  for around the umbilicus where used five interrupted sutures of #1 Novofil.  The wound was irrigated with saline.  I placed a few skin staples to  partially close the wound placed Telfa wicks in the wound.   I then matured the colostomy with about 10 interrupted sutures of 3-0  Vicryl.  I amputated the staple line to do this and the colon looked pink  and viable and had good bleeding.  After I matured the colostomy,  I could  easily pass my finger down through the abdominal wall into the perineal  cavity through the lumen of the colon so there did not appear to be any  obstruction.  Colostomy bag was placed here.  A bandage was placed on the  wound.  The patient tolerated procedure well was taken recovery room in  stable condition.  Estimated blood loss was about 200 mL.  Complications  none.  Sponge, needle, instrument counts were correct.      Angelia Mould. Derrell Lolling, M.D.  Electronically Signed     HMI/MEDQ  D:  09/09/2006  T:  09/11/2006  Job:  [id number]   cc:   Olene Craven, M.D.  Fax: [phone]

## 2011-04-15 NOTE — Discharge Summary (Signed)
NAME:  Christopher Guerrero, VO NO.:  [id number]   MEDICAL RECORD NO.:  [id number]          PATIENT TYPE:  INP   LOCATION:  1342                         FACILITY:  [facility]   PHYSICIAN:  Angelia Mould. Derrell Lolling, M.D.DATE OF BIRTH:  03-Aug-1966   DATE OF ADMISSION:  09/06/2007  DATE OF DISCHARGE:  09/11/2007                               DISCHARGE SUMMARY   FINAL DIAGNOSES:  1. Low volume enterocutaneous fistula, question foreign body etiology.  2. Presumed anastomotic inflammation.  3. Status post two-stage sigmoid colon resection.  4. Perforated diverticulitis.   OPERATIONS PERFORMED:  None.   HISTORY:  This is a 45 year old white man who had emergent sigmoid colon  resection and colostomy on September 09, 2006 with uneventful recovery and  colostomy closure performed on December 15, 2006.  He had to be  readmitted in March of 2008 with abdominal pain and CT findings of  thickening of the colon around the anastomosis just behind the left  rectus sheath and some air bubbles in the area but no abscess.  He was  treated with hyperalimentation and became asymptomatic.   He then did well for 6 months and had no symptoms with normal appetite,  returned to full activities and normal bowel function.  He came back to  the office about 2 weeks prior to this admission with an abscess in the  wound which was drained in the office and a little bit of tan creamy pus  and air came out of the wound.  He temporarily improved, but then  developed more pain, and a CT scan showed persistent thickening of the  left colon just below the left rectus sheath and what looked like some  air bubbles tracking into the wound.  A fistulogram was performed which  showed a very tiny sinus tract extending into the periumbilical wound  and entering a loop of small bowel.  There was no communication with the  colon.   It should be noted that while we were managing this wound as an  outpatient, I pulled two or  three Novofil sutures out of the wound..  The patient was on oral antibiotics.  I admitted the patient to the  hospital at this time for bowel rest, hyperalimentation and further  evaluation.  It should be noted that he continues to have stools, has no  nausea or vomiting or abdominal distention and no sign of obstruction.  There was also no sign of abscess on his CT.   HOSPITAL COURSE:  The patient was admitted.  A PICC line was started,  and he was started on hyperalimentation and placed on simply a clear  liquid diet.   I went out of town for several days and Dr. Avel Peace cared for  him during my absence.   A water-soluble enema was performed on September 07, 2007, and this showed  some diverticulosis, but there was no obstruction.  The full colon was  examined.  There was no fistula or leak seen anywhere.   The patient was left on a clear liquid diet and hyperalimentation for  the next  few days.  A follow-up abscess was performed on September 11, 2007 which showed no abscess and just a tiny bit of air in the fistula  tract, but no contrast in the fistula tract.  The patient's abdomen was  soft.  A little bit of drainage from the dressing.  The patient was  feeling better, had no pain or tenderness.  Dr. Abbey Chatters elected to  send the patient home on home hyperalimentation, bowel rest and oral  antibiotics.  The patient was to follow up as an outpatient.   Consideration will be given to colonoscopy as an outpatient to take a  look at the anastomosis in the colon to see if there is any problem.  Consideration will be given to allowing this to resolve with bowel and  rest and hyperalimentation, but if the patient's symptoms recur, he has  been informed that he may require reoperation and resection.      Angelia Mould. Derrell Lolling, M.D.  Electronically Signed     HMI/MEDQ  D:  09/17/2007  T:  09/17/2007  Job:  [id number]   cc:   Olene Craven, M.D.  Fax: [phone]   WGNFAO  ZHY QMVH, M.D.  Fax: [phone]

## 2011-04-15 NOTE — Op Note (Signed)
NAME:  Christopher Guerrero, Christopher Guerrero                ACCOUNT NO.:  [id number]   MEDICAL RECORD NO.:  [id number]          PATIENT TYPE:  AMB   LOCATION:  ENDO                         FACILITY:  [facility]   PHYSICIAN:  Anselmo Rod, M.D.  DATE OF BIRTH:  15-Apr-1966   DATE OF PROCEDURE:  08/30/2004  DATE OF DISCHARGE:                                 OPERATIVE REPORT   PROCEDURE:  Esophagogastroduodenoscopy.   ENDOSCOPIST:  Anselmo Rod, M.D.   INSTRUMENT USED:  Olympus video panendoscope.   INDICATIONS FOR PROCEDURE:  Epigastric pain in a 45 year old white male,  rule out peptic ulcer disease, esophagitis, gastritis, etc.   PREPROCEDURE PREPARATION:  Informed consent was procured from the patient.  The patient was fasted for eight hours prior to the procedure.   PREPROCEDURE PHYSICAL EXAMINATION:  VITAL SIGNS:  Stable.  NECK:  Supple.  CHEST:  Clear to auscultation.  S1 and S2 regular.  ABDOMEN:  Soft with normal bowel sounds.   DESCRIPTION OF PROCEDURE:  The patient was placed in the left lateral  decubitus position and sedated with 75 mg of Demerol and 7.5 mg of Versed in  incremental doses.  Once the patient was adequately sedated and maintained  on low flow oxygen and continuous cardiac monitoring, the Olympus video  panendoscope was advanced over the mouthpiece, over the tongue, into the  esophagus under direct vision.  The entire esophagus appeared normal with no  evidence of ring, stricture, mass, esophagitis, or Barrett's mucosa.  The  scope was then advanced to the stomach.  The entire gastric mucosa and  proximal small bowel appeared normal.   IMPRESSION:  Normal EGD.   RECOMMENDATIONS:  Proceed with abdominal ultrasound and Hiatus scan with DCK  injection.  Follow a low fat diet for now.  Further recommendations made  thereafter.       JNM/MEDQ  D:  08/30/2004  T:  08/30/2004  Job:  [id number]   cc:   Olene Craven, M.D.  [address]  [address]  [place]  [place] [address]  Fax: [phone]

## 2011-04-15 NOTE — Discharge Summary (Signed)
NAME:  Christopher Guerrero, Christopher Guerrero NO.:  [id number]   MEDICAL RECORD NO.:  [id number]          PATIENT TYPE:  INP   LOCATION:  5015                         FACILITY:  [facility]   PHYSICIAN:  Michaelyn Barter, M.D. DATE OF BIRTH:  08-Jun-1966   DATE OF ADMISSION:  02/02/2007  DATE OF DISCHARGE:                               DISCHARGE SUMMARY   DATE OF DISCHARGE:  Yet to be determined.   PRIMARY CARE PHYSICIAN:  Olene Craven, MD.   SURGEONAngelia Mould. Derrell Lolling, MD.   FINAL DIAGNOSES:  1. Abdominal pain.  2. Mild dehydration.  3. Fever.   PROCEDURES:  1. CT scan of the abdomen and pelvis with contrast completed February 02, 2007.  2. Repeat CT scan of the abdomen and pelvis completed February 05, 2007.  3. Two-D echocardiogram completed February 08, 2007.  4. Portable chest x-ray completed February 09, 2007.  5. CT scan of the abdomen and pelvis completed February 09, 2007.   CONSULTATIONS:  General Surgery with Dr. Jimmye Norman.   HISTORY OF PRESENT ILLNESS:  Mr. Panning is a 45 year old gentleman with  a history of diverticulosis and diverticulitis, who had been  experiencing abdominal pain, fevers, chills since having his prior  colostomy surgically altered on December 15, 2006.  He also complained of  an approximate eight pound weight loss the weeks leading up to his  admission.  The patient's primary care physician had performed a CAT  scan as an outpatient.  Diverticulitis as well as a perforation at the  anastomotic site were revealed.  The patient was told to come to the  hospital for further evaluation.  For past medical history, please see  that dictated by Dr. Della Goo.   HOSPITAL COURSE:  1. Abdominal pain:  A CT scan of the patient's abdomen and pelvis was      completed on February 02, 2007.  It revealed edema of the sigmoid      colon at the site of the re-anastomosis with surrounding      strandedness.  Free intraperitoneal air just anterior to the  anastomotic site was seen.  Recurrent diverticulitis and      microperforation were believed to be the underlying cause of the      patient's abdominal CT findings.  Anastomotic leak would be much      less likely in view of the time interval since the prior      reanastomosis.  General Surgery was consulted.  At the time of his      admission, the patient was started on empiric IV ciprofloxacin 400      mg q.12h and metronidazole 500 mg IV q.8h.  He has also been      provided with pain medication on a p.r.n. basis.  The patient's      pain has been relatively well controlled over the past several      days.  Repeat CT scan completed on March 10th revealed no      significant improvement in distal descending colon wall thickening  with extraluminal loculated gas extending into the presumed ostomy      tract without discrete or drainable fluid component.  A CT scan of      the abdomen and pelvis was finally repeated for the third time on      March 14th, which revealed that at the site of colon anastomosis in      the left colon, there was some mild improvement in the amount of      circumferential edema.  There was interval increase in the      extraluminal gas medial to the anastomosis suggesting an      anastomotic leak.  The gas bubbles were likely part of an abscess      filled with gas, and no drainable fluid collection was identified.      General Surgery is in the process of evaluating how to further      address the patient's current abnormal CT findings.  2. Nutrition:  The patient has had little to no oral nutrition      throughout his hospitalization.  TNA has been ordered per Surgery.  3. Asymptomatic bradycardia:  The patient was noted to have had a      heart rate that dipped into the 40s throughout the last one to two      days of his hospitalization.  He has otherwise been asymptomatic      and has denied chest pain or shortness of breath.  A two-D      echocardiogram  was completed on March 13th, which revealed overall      left ventricular systolic function to be at the lower limits of      normal.  Left ventricular EF was estimated to range between 50 to      55%.  There was no diagnostic evidence of left ventricular regional      wall motion abnormalities.  A 12-lead EKG was also completed, which      essentially revealed only sinus bradycardia.  No other significant      abnormalities were noted.  Therefore, this condition has simply      been monitored.      Michaelyn Barter, M.D.  Electronically Signed     OR/MEDQ  D:  02/10/2007  T:  02/10/2007  Job:  [id number]   cc:   Olene Craven, M.D.  Angelia Mould. Derrell Lolling, M.D.

## 2011-08-22 LAB — DIFFERENTIAL
Basophils Absolute: 0
Lymphocytes Relative: 20
Monocytes Absolute: 0.3
Monocytes Relative: 4
Neutro Abs: 6.5

## 2011-08-22 LAB — ANAEROBIC CULTURE

## 2011-08-22 LAB — URINE MICROSCOPIC-ADD ON

## 2011-08-22 LAB — URINALYSIS, ROUTINE W REFLEX MICROSCOPIC
Bilirubin Urine: NEGATIVE
Nitrite: NEGATIVE
Specific Gravity, Urine: 1.016
pH: 5.5

## 2011-08-22 LAB — CBC
HCT: 39.9
MCHC: 33.6
MCV: 82.9
Platelets: 229
RDW: 19.9 — ABNORMAL HIGH

## 2011-08-22 LAB — COMPREHENSIVE METABOLIC PANEL
Albumin: 3.6
BUN: 8
Creatinine, Ser: 0.98
Total Bilirubin: 0.5
Total Protein: 6.9

## 2011-08-22 LAB — GRAM STAIN

## 2011-08-22 LAB — WOUND CULTURE

## 2011-09-02 LAB — POTASSIUM: Potassium: 3.7

## 2011-09-02 LAB — CULTURE, ROUTINE-ABSCESS

## 2011-09-02 LAB — URINALYSIS, ROUTINE W REFLEX MICROSCOPIC
Leukocytes, UA: NEGATIVE
Nitrite: NEGATIVE
Protein, ur: 30 — AB
Specific Gravity, Urine: 1.023
Urobilinogen, UA: 1

## 2011-09-02 LAB — URINE CULTURE: Colony Count: 3000

## 2011-09-02 LAB — CBC
HCT: 25.6 — ABNORMAL LOW
HCT: 26.1 — ABNORMAL LOW
Hemoglobin: 8.4 — ABNORMAL LOW
Hemoglobin: 8.9 — ABNORMAL LOW
Hemoglobin: 9.4 — ABNORMAL LOW
MCHC: 34.6
MCHC: 34.6
MCHC: 34.9
MCV: 89.2
MCV: 89.5
MCV: 90
MCV: 90.1
Platelets: 202
Platelets: 235
Platelets: 257
RBC: 2.86 — ABNORMAL LOW
RBC: 3.03 — ABNORMAL LOW
RDW: 13.2
RDW: 13.7
RDW: 14
WBC: 16.9 — ABNORMAL HIGH
WBC: 7.5
WBC: 9.7

## 2011-09-02 LAB — CREATININE, SERUM: Creatinine, Ser: 0.89

## 2011-09-02 LAB — COMPREHENSIVE METABOLIC PANEL
BUN: 5 — ABNORMAL LOW
CO2: 26
Chloride: 103
Creatinine, Ser: 0.89
GFR calc non Af Amer: >60
Total Bilirubin: 0.7

## 2011-09-02 LAB — BASIC METABOLIC PANEL
BUN: 6
Chloride: 102
GFR calc non Af Amer: >60
Potassium: 3.7
Sodium: 132 — ABNORMAL LOW

## 2011-09-02 LAB — URINE MICROSCOPIC-ADD ON

## 2011-09-02 LAB — CLOSTRIDIUM DIFFICILE EIA

## 2011-09-02 LAB — ANAEROBIC CULTURE

## 2011-09-05 LAB — CATH TIP CULTURE: Culture: NO GROWTH

## 2011-09-05 LAB — URINALYSIS, ROUTINE W REFLEX MICROSCOPIC
Bilirubin Urine: NEGATIVE
Hgb urine dipstick: NEGATIVE
Ketones, ur: NEGATIVE
Protein, ur: NEGATIVE
Urobilinogen, UA: 0.2

## 2011-09-05 LAB — BASIC METABOLIC PANEL
BUN: 5 — ABNORMAL LOW
CO2: 23
Chloride: 104
Chloride: 105
GFR calc Af Amer: >60
GFR calc non Af Amer: >60
Glucose, Bld: 139 — ABNORMAL HIGH
Glucose, Bld: 161 — ABNORMAL HIGH
Potassium: 4
Potassium: 4.2
Sodium: 133 — ABNORMAL LOW
Sodium: 134 — ABNORMAL LOW

## 2011-09-05 LAB — CBC
HCT: 24.8 — ABNORMAL LOW
HCT: 33 — ABNORMAL LOW
HCT: 39.2
Hemoglobin: 11.5 — ABNORMAL LOW
Hemoglobin: 13.4
Hemoglobin: 15.3
Hemoglobin: 8.7 — ABNORMAL LOW
MCHC: 34.3
MCV: 90.6
Platelets: 136 — ABNORMAL LOW
RBC: 2.75 — ABNORMAL LOW
RBC: 4.32
RBC: 4.85
RDW: 13.5
RDW: 13.7
WBC: 13.5 — ABNORMAL HIGH

## 2011-09-05 LAB — CULTURE, ROUTINE-ABSCESS: Culture: NO GROWTH

## 2011-09-05 LAB — APTT: aPTT: 32

## 2011-09-05 LAB — COMPREHENSIVE METABOLIC PANEL
ALT: 25
AST: 21
Alkaline Phosphatase: 66
CO2: 28
Glucose, Bld: 96
Potassium: 4.6
Sodium: 142
Total Protein: 6.7

## 2011-09-05 LAB — ANAEROBIC CULTURE

## 2011-09-05 LAB — PROTIME-INR: INR: 1.3

## 2011-09-05 LAB — DIFFERENTIAL
Basophils Relative: 1
Eosinophils Absolute: 1.5 — ABNORMAL HIGH
Eosinophils Relative: 16 — ABNORMAL HIGH
Monocytes Relative: 6
Neutrophils Relative %: 56

## 2011-09-08 LAB — CBC
HCT: 38 — ABNORMAL LOW
Hemoglobin: 12.2 — ABNORMAL LOW
Hemoglobin: 13.2
MCHC: 33.9
MCHC: 34.7
MCHC: 34.8
MCV: 92.2
MCV: 92.2
Platelets: 186
Platelets: 193
RBC: 4.12 — ABNORMAL LOW
RDW: 12.5
RDW: 12.9
WBC: 9.4

## 2011-09-08 LAB — COMPREHENSIVE METABOLIC PANEL
ALT: 8
AST: 14
AST: 15
Albumin: 3.1 — ABNORMAL LOW
Albumin: 3.3 — ABNORMAL LOW
BUN: 5 — ABNORMAL LOW
BUN: 8
CO2: 25
Calcium: 8.9
Calcium: 8.9
Calcium: 9.3
Chloride: 109
Creatinine, Ser: 1
Creatinine, Ser: 1.07
GFR calc Af Amer: >60
GFR calc non Af Amer: >60
GFR calc non Af Amer: >60
Glucose, Bld: 107 — ABNORMAL HIGH
Glucose, Bld: 95
Sodium: 139
Total Bilirubin: 0.6
Total Protein: 5.7 — ABNORMAL LOW

## 2011-09-08 LAB — DIFFERENTIAL
Basophils Absolute: 0
Basophils Relative: 0
Eosinophils Relative: 2
Lymphocytes Relative: 19
Lymphocytes Relative: 27
Lymphs Abs: 1.5
Monocytes Absolute: 0.5
Monocytes Relative: 5
Neutro Abs: 4.4
Neutrophils Relative %: 66

## 2011-09-08 LAB — BASIC METABOLIC PANEL
CO2: 24
Calcium: 9
Creatinine, Ser: 1
Glucose, Bld: 94

## 2011-09-08 LAB — MAGNESIUM
Magnesium: 1.9
Magnesium: 2.1

## 2011-09-08 LAB — TRIGLYCERIDES: Triglycerides: 108

## 2011-09-08 LAB — PHOSPHORUS
Phosphorus: 3.7
Phosphorus: 4.6

## 2011-09-08 LAB — TSH: TSH: 2.418

## 2011-09-08 LAB — PREALBUMIN
Prealbumin: 20.8
Prealbumin: 22.3

## 2011-09-08 LAB — CHOLESTEROL, TOTAL: Cholesterol: 65

## 2011-12-15 ENCOUNTER — Encounter (HOSPITAL_COMMUNITY): Payer: Self-pay | Admitting: *Deleted

## 2011-12-15 ENCOUNTER — Other Ambulatory Visit (INDEPENDENT_AMBULATORY_CARE_PROVIDER_SITE_OTHER): Payer: BC Managed Care – PPO

## 2011-12-15 ENCOUNTER — Ambulatory Visit (INDEPENDENT_AMBULATORY_CARE_PROVIDER_SITE_OTHER)
Admission: RE | Admit: 2011-12-15 | Discharge: 2011-12-15 | Disposition: A | Discharge status: Home / Self Care | Payer: BC Managed Care – PPO | Source: Ambulatory Visit | Attending: Internal Medicine | Admitting: Internal Medicine

## 2011-12-15 ENCOUNTER — Emergency Department (HOSPITAL_COMMUNITY): Payer: BC Managed Care – PPO

## 2011-12-15 ENCOUNTER — Ambulatory Visit (INDEPENDENT_AMBULATORY_CARE_PROVIDER_SITE_OTHER): Payer: BC Managed Care – PPO | Admitting: Internal Medicine

## 2011-12-15 ENCOUNTER — Inpatient Hospital Stay (HOSPITAL_COMMUNITY)
Admission: EM | Admit: 2011-12-15 | Discharge: 2011-12-19 | DRG: 182 | Disposition: A | Discharge status: Home / Self Care | Payer: BC Managed Care – PPO | Attending: Internal Medicine | Admitting: Internal Medicine

## 2011-12-15 ENCOUNTER — Encounter: Payer: Self-pay | Admitting: Internal Medicine

## 2011-12-15 VITALS — BP 122/80 | HR 93 | Temp 98.7°F | Resp 16 | Wt 191.0 lb

## 2011-12-15 DIAGNOSIS — E860 Dehydration: Secondary | ICD-10-CM | POA: Diagnosis present

## 2011-12-15 DIAGNOSIS — K219 Gastro-esophageal reflux disease without esophagitis: Secondary | ICD-10-CM | POA: Diagnosis present

## 2011-12-15 DIAGNOSIS — K5792 Diverticulitis of intestine, part unspecified, without perforation or abscess without bleeding: Secondary | ICD-10-CM | POA: Diagnosis present

## 2011-12-15 DIAGNOSIS — E871 Hypo-osmolality and hyponatremia: Secondary | ICD-10-CM | POA: Diagnosis present

## 2011-12-15 DIAGNOSIS — R10814 Left lower quadrant abdominal tenderness: Secondary | ICD-10-CM

## 2011-12-15 DIAGNOSIS — K5732 Diverticulitis of large intestine without perforation or abscess without bleeding: Principal | ICD-10-CM | POA: Diagnosis present

## 2011-12-15 LAB — CBC
HCT: 43.8 % (ref 39.0–52.0)
MCV: 93.8 fL (ref 78.0–100.0)
Platelets: 172 10*3/uL (ref 150–400)
RBC: 4.67 MIL/uL (ref 4.22–5.81)
WBC: 14.9 10*3/uL — ABNORMAL HIGH (ref 4.0–10.5)

## 2011-12-15 LAB — CBC WITH DIFFERENTIAL/PLATELET
Basophils Absolute: 0.1 10*3/uL (ref 0.0–0.1)
Hemoglobin: 16.1 g/dL (ref 13.0–17.0)
Lymphocytes Relative: 13.6 % (ref 12.0–46.0)
Monocytes Relative: 6.2 % (ref 3.0–12.0)
Neutro Abs: 13.2 10*3/uL — ABNORMAL HIGH (ref 1.4–7.7)
Neutrophils Relative %: 79.6 % — ABNORMAL HIGH (ref 43.0–77.0)
RDW: 13.5 % (ref 11.5–14.6)

## 2011-12-15 LAB — COMPREHENSIVE METABOLIC PANEL
ALT: 8 U/L (ref 0–53)
CO2: 24 mEq/L (ref 19–32)
Calcium: 10.1 mg/dL (ref 8.4–10.5)
Chloride: 99 mEq/L (ref 96–112)
GFR calc Af Amer: 82 mL/min — ABNORMAL LOW (ref >90)
GFR calc non Af Amer: 71 mL/min — ABNORMAL LOW (ref >90)
Glucose, Bld: 93 mg/dL (ref 70–99)
Sodium: 135 mEq/L (ref 135–145)
Total Bilirubin: 0.9 mg/dL (ref 0.3–1.2)

## 2011-12-15 LAB — DIFFERENTIAL
Eosinophils Relative: 0 % (ref 0–5)
Lymphocytes Relative: 13 % (ref 12–46)
Lymphs Abs: 2 10*3/uL (ref 0.7–4.0)
Monocytes Absolute: 1.2 10*3/uL — ABNORMAL HIGH (ref 0.1–1.0)
Neutro Abs: 11.6 10*3/uL — ABNORMAL HIGH (ref 1.7–7.7)

## 2011-12-15 LAB — URINALYSIS, ROUTINE W REFLEX MICROSCOPIC
Leukocytes, UA: NEGATIVE
Urobilinogen, UA: 2 (ref 0.0–1.0)

## 2011-12-15 LAB — SEDIMENTATION RATE: Sed Rate: 21 mm/hr (ref 0–22)

## 2011-12-15 MED ORDER — ONDANSETRON HCL 4 MG/2ML IJ SOLN
4.0000 mg | Freq: Once | INTRAMUSCULAR | Status: AC
  Administered 2011-12-15: 4 mg via INTRAVENOUS
  Filled 2011-12-15: qty 2

## 2011-12-15 MED ORDER — CIPROFLOXACIN IN D5W 400 MG/200ML IV SOLN
400.0000 mg | Freq: Once | INTRAVENOUS | Status: AC
  Administered 2011-12-15: 400 mg via INTRAVENOUS
  Filled 2011-12-15: qty 200

## 2011-12-15 MED ORDER — ACETAMINOPHEN 325 MG PO TABS
650.0000 mg | ORAL_TABLET | Freq: Once | ORAL | Status: AC
  Administered 2011-12-15: 650 mg via ORAL
  Filled 2011-12-15: qty 2

## 2011-12-15 MED ORDER — PROMETHAZINE HCL 12.5 MG PO TABS: 12.5000 mg | ORAL_TABLET | Freq: Four times a day (QID) | ORAL | Status: DC | PRN

## 2011-12-15 MED ORDER — METRONIDAZOLE IN NACL 5-0.79 MG/ML-% IV SOLN
500.0000 mg | Freq: Once | INTRAVENOUS | Status: AC
  Administered 2011-12-15: 500 mg via INTRAVENOUS
  Filled 2011-12-15: qty 100

## 2011-12-15 MED ORDER — SODIUM CHLORIDE 0.9 % IV BOLUS (SEPSIS)
500.0000 mL | Freq: Once | INTRAVENOUS | Status: AC
  Administered 2011-12-15: 500 mL via INTRAVENOUS

## 2011-12-15 MED ORDER — MORPHINE SULFATE 4 MG/ML IJ SOLN
4.0000 mg | Freq: Once | INTRAMUSCULAR | Status: AC
  Administered 2011-12-15: 4 mg via INTRAVENOUS
  Filled 2011-12-15: qty 1

## 2011-12-15 MED ORDER — IOHEXOL 300 MG/ML  SOLN
100.0000 mL | Freq: Once | INTRAMUSCULAR | Status: AC | PRN
  Administered 2011-12-15: 100 mL via INTRAVENOUS

## 2011-12-15 MED ORDER — SODIUM CHLORIDE 0.9 % IV SOLN
INTRAVENOUS | Status: DC
  Administered 2011-12-15: 1000 mL via INTRAVENOUS
  Administered 2011-12-17 – 2011-12-18 (×2): via INTRAVENOUS

## 2011-12-15 MED ORDER — METRONIDAZOLE 500 MG PO TABS: 500.0000 mg | ORAL_TABLET | Freq: Three times a day (TID) | ORAL | Status: DC

## 2011-12-15 MED ORDER — CIPROFLOXACIN HCL 500 MG PO TABS: 500.0000 mg | ORAL_TABLET | Freq: Two times a day (BID) | ORAL | Status: DC

## 2011-12-15 NOTE — ED Notes (Signed)
Pt returned from CT to ED25.

## 2011-12-15 NOTE — ED Notes (Signed)
Sent from [place], had acute abd series performed, suspicious for SBO.

## 2011-12-15 NOTE — Patient Instructions (Signed)

## 2011-12-15 NOTE — ED Notes (Signed)
Patient transported to CT 

## 2011-12-15 NOTE — ED Notes (Signed)
Pt states "felt a pain on Tuesday, thought it was a virus, I've had a colostomy and multiple surgeries before, no BM since Tuesday"

## 2011-12-15 NOTE — ED Notes (Signed)
[place], EDP at bedside explaining CT results and plan of care.

## 2011-12-15 NOTE — Assessment & Plan Note (Addendum)
With his history my main concern is that he has a flare of diverticulitis +/- a complication like abscess, rupture, etc. I am also concerned about renal stones and infection, ileus, obstruction, etc so I have started him on antibiotics and have ordered some diagnostic testing as well  Late note: the xray shows a SBO so I asked him to report to the ER for urgent evaluation by a surgeon.

## 2011-12-15 NOTE — Progress Notes (Signed)
Subjective:    Patient ID: Christopher Guerrero, male    DOB: 1966/08/14, 46 y.o.   MRN: [id number]  Abdominal Pain This is a new problem. Episode onset: for 3 days. The onset quality is sudden. The problem occurs constantly. The most recent episode lasted 3 days. The problem has been gradually worsening. The pain is located in the LLQ. The pain is at a severity of 3/10. The pain is mild. The quality of the pain is cramping. The abdominal pain does not radiate. Associated symptoms include anorexia, constipation, a fever and nausea. Pertinent negatives include no arthralgias, belching, diarrhea, dysuria, flatus, frequency, headaches, hematochezia, hematuria, melena, myalgias, vomiting or weight loss. The pain is aggravated by nothing. The pain is relieved by nothing. He has tried nothing for the symptoms. His past medical history is significant for abdominal surgery.      Review of Systems  Constitutional: Positive for fever. Negative for weight loss.  Gastrointestinal: Positive for nausea, abdominal pain, constipation and anorexia. Negative for vomiting, diarrhea, melena, hematochezia and flatus.  Genitourinary: Negative for dysuria, frequency and hematuria.  Musculoskeletal: Negative for myalgias and arthralgias.  Neurological: Negative for headaches.       Objective:   Physical Exam  Vitals reviewed. Constitutional: He is oriented to person, place, and time. He appears well-developed and well-nourished. No distress.  HENT:  Head: Normocephalic and atraumatic.  Mouth/Throat: Oropharynx is clear and moist. No oropharyngeal exudate.  Eyes: Conjunctivae are normal. Right eye exhibits no discharge. Left eye exhibits no discharge. No scleral icterus.  Neck: Normal range of motion. Neck supple. No JVD present. No tracheal deviation present. No thyromegaly present.  Cardiovascular: Normal rate, regular rhythm, normal heart sounds and intact distal pulses.  Exam reveals no gallop and no friction rub.    No murmur heard. Pulmonary/Chest: Effort normal and breath sounds normal. No stridor. No respiratory distress. He has no wheezes. He has no rales. He exhibits no tenderness.  Abdominal: Soft. Normal appearance and bowel sounds are normal. He exhibits no shifting dullness, no distension, no pulsatile liver, no fluid wave, no abdominal bruit, no ascites, no pulsatile midline mass and no mass. There is tenderness in the left lower quadrant. There is no rigidity, no rebound, no guarding, no CVA tenderness, no tenderness at McBurney's point and negative Murphy's sign. No hernia. Hernia confirmed negative in the ventral area, confirmed negative in the right inguinal area and confirmed negative in the left inguinal area.         scars  Genitourinary: Rectum normal, prostate normal, testes normal and penis normal. Rectal exam shows no external hemorrhoid, no internal hemorrhoid, no fissure, no mass, no tenderness and anal tone normal. Guaiac negative stool. Prostate is not enlarged and not tender. Right testis shows no mass, no swelling and no tenderness. Right testis is descended. Left testis shows no mass, no swelling and no tenderness. Left testis is descended. Circumcised. No penile tenderness. No discharge found.  Musculoskeletal: Normal range of motion. He exhibits no edema and no tenderness.  Lymphadenopathy:    He has no cervical adenopathy.       Right: No inguinal adenopathy present.       Left: No inguinal adenopathy present.  Neurological: He is oriented to person, place, and time.  Skin: Skin is warm and dry. No rash noted. He is not diaphoretic. No erythema. No pallor.  Psychiatric: He has a normal mood and affect. His behavior is normal. Judgment and thought content normal.  Assessment & Plan:

## 2011-12-15 NOTE — H&P (Signed)
Christopher Guerrero MRN: [id number] DOB/AGE: 1966-03-28 46 y.o. Primary Care Physician:Thomas Yetta Barre, MD, MD Admit date: 12/15/2011 Chief Complaint: Abdominal pain, nausea, vomiting HPI:  Christopher Guerrero is a pleasant 46 year old gentleman with a history of gastroesophageal reflux disease, history of perforated diverticulitis status post 2 stage colonic resection 09/09/2006, history of enterocutaneous fistula and colo-cutaneous fistula with abdominal wall abscess status post expiratory laparotomy with resection of enterocutaneous fistula segmental small bowel resection segmental left colon resection with primary anastomosis resection of abdominal wall including skin subcutaneous tissue muscle and fascia with indicated appendectomy and complex abdominal wall closure. Patient also had an extensive hospitalization for this enterocutaneous for some abdominal pain 2008. Patient also with a history of prostatitis presented to the ED with a three-day history of sudden onset of  left lower quadrant abdominal pain, nausea, emesis, fever with a temperature of 100, chills, generalized weakness. Patient states hasn't had a bowel movement in the past 2 days. Patient denies any chest pain, no shortness of breath, no diarrhea, no cough, no focal neurological symptoms. Patient presented to his PCPs office on the day of admission x-rays which were done were concerning for small bowel obstruction and patient was subsequently sent to the ED for further evaluation and management. In the ED CT scan of the abdomen and pelvis were performed which did show a descending colonic diverticulitis was negative for any abscess or perforation or obstruction. Patient was given some IV Cipro and Flagyl we will called to admit the patient for further evaluation and management.   Past Medical History  Diagnosis Date  . GERD (gastroesophageal reflux disease)   . Gout   . Diverticulitis     Past Surgical History  Procedure Date  . Colectomy 2007     sigmoid colon resection and colostomy  . Colon surgery 2008    colostomy closure  . Colon surgery 2008    resection of enterocutaneous fistula, small bowel resection, L colon resection with primary anastomosis, abd wall resection  . Appendectomy 2008  . Abdominal debridement 2009    Prior to Admission medications   Not on File    Allergies: No Known Allergies  Family History  Problem Relation Age of Onset  . Arthritis Other   . Hypertension Other   . Coronary artery disease Other     Social History:  reports that he has been smoking Cigarettes.  He has a 400 pack-year smoking history. He has never used smokeless tobacco. He reports that he does not drink alcohol or use illicit drugs.  ROS: All systems reviewed with the patient and was positive as per HPI otherwise all other systems are negative.  PHYSICAL EXAM: Blood pressure 132/78, pulse 86, temperature 101.8 F (38.8 C), temperature source Oral, resp. rate 18, weight 86.637 kg (191 lb), SpO2 98.00%. General: Well-developed well-nourished in no acute cardiopulmonary distress.  HEENT: Normocephalic atraumatic. Pupils equal round and reactive to light and accommodation. Extraocular movements intact. Oropharynx is clear, no lesions, no exudates. Neck is supple with no lymphadenopathy. No bruits, no goiter. Heart: Regular rate and rhythm, without murmurs, rubs, gallops. Lungs: Clear to auscultation bilaterally. Abdomen: Soft, tender to palpation in the left lower quadrant greater than the right lower quadrant and mid abdominal region. Positive bowel sounds., nondistended. No rebound no guarding. Extremities: No clubbing cyanosis or edema with positive pedal pulses. Neuro: Alert and onto x3. Cranial nerves II through XII are grossly intact.    EKG: None  No results found for this or any previous  visit (from the past 240 hour(s)).   Lab results:  [facility] 12/15/11 1539  NA 135  K 4.3  CL 99  CO2 24  GLUCOSE 93  BUN  15  CREATININE 1.20  CALCIUM 10.1  MG --  PHOS --    Basename 12/15/11 1539  AST 13  ALT 8  ALKPHOS 87  BILITOT 0.9  PROT 7.5  ALBUMIN 4.0    Basename 12/15/11 1539  LIPASE 17  AMYLASE --    Basename 12/15/11 1539  WBC 14.9*  NEUTROABS 11.6*  HGB 15.3  HCT 43.8  MCV 93.8  PLT 172   No results found for this basename: CKTOTAL:3,CKMB:3,CKMBINDEX:3,TROPONINI:3 in the last 72 hours No components found with this basename: POCBNP:3 No results found for this basename: DDIMER in the last 72 hours No results found for this basename: HGBA1C:2 in the last 72 hours No results found for this basename: CHOL:2,HDL:2,LDLCALC:2,TRIG:2,CHOLHDL:2,LDLDIRECT:2 in the last 72 hours No results found for this basename: TSH,T4TOTAL,FREET3,T3FREE,THYROIDAB in the last 72 hours No results found for this basename: VITAMINB12:2,FOLATE:2,FERRITIN:2,TIBC:2,IRON:2,RETICCTPCT:2 in the last 72 hours Imaging results:  Ct Abdomen Pelvis W Contrast  12/15/2011  *RADIOLOGY REPORT*  Clinical Data: Left-sided abdominal pain, history of diverticulitis and colon surgery, evaluate for small bowel obstruction  CT ABDOMEN AND PELVIS WITH CONTRAST  Technique:  Multidetector CT imaging of the abdomen and pelvis was performed following the standard protocol during bolus administration of intravenous contrast.  Contrast: OMNIPAQUE IOHEXOL 300 MG/ML IV SOLN  Comparison: [facility] CT abdomen/pelvis dated 02/20/2009  Findings: Dependent atelectasis at the lung bases.  Three hypoenhancing lesions in the posterior segment right hepatic lobe (series 2/images 24 and 31) and left hepatic dome (series 2/image 11), likely benign.  Spleen, pancreas, and adrenal glands are within normal limits.  Gallbladder is unremarkable.  No intrahepatic or extrahepatic ductal dilatation.  Kidneys within normal limits.  No hydronephrosis.  No evidence of bowel obstruction.  Prior bowel resection with suture lines in the right abdomen.   Colonic diverticulosis with surrounding inflammatory changes involving a loop of descending colon (series 2/image 30), compatible with diverticulitis.  No drainable fluid collection or abscess.  No free air.  Atherosclerotic calcifications of the abdominal aorta and branch vessels.  No abdominopelvic ascites.  No suspicious abdominopelvic lymphadenopathy.  Prostate is unremarkable.  Bladder is within normal limits.  Mild degenerative changes of the visualized thoracolumbar spine.  IMPRESSION: Descending colonic diverticulitis.  No drainable fluid collection or abscess.  No free air.  No evidence of bowel obstruction.  Prior bowel resection in the right abdomen.  Original Report Authenticated By: Charline Bills, M.D.   Dg Abd Acute W/chest  12/15/2011  *RADIOLOGY REPORT*  Clinical Data: Left lower quadrant pain  ACUTE ABDOMEN SERIES (ABDOMEN 2 VIEW & CHEST 1 VIEW)  Comparison: 02/20/2009  Findings: Heart size is normal.  No pleural effusion or pulmonary edema.  No airspace consolidation identified.  There are dilated loops of small bowel identified.  These measure up to 4 cm.  Within the right lower quadrant of the abdomen there are several air-fluid levels identified.  IMPRESSION:  1.  No active cardiopulmonary abnormalities.  2.  Small bowel dilatation with air-fluid levels suspicious for bowel obstruction.  Original Report Authenticated By: Rosealee Albee, M.D.   Impression/Plan:  Principal Problem:  *Diverticulitis Active Problems:  GERD  Dehydration   #1 acute diverticulitis of the descending colon Patient does have a history of prior perforated diverticulitis requiring colonic resection and extensive hospitalization with  reanastomosis and enterocutaneous fistula. Will place patient on bowel rest. We'll place patient empirically on IV Zosyn. IV fluids. Supportive care. Spoke with Gen. surgery and asked that patient be consulted upon tomorrow as patient does have a extensive surgical  history. #2 dehydration  IV fluids #3 gastroesophageal reflux disease  PPI #4 prophylaxis PPI for GI prophylaxis, Lovenox for DVT prophylaxis.   Laurielle Selmon 12/15/2011, 10:10 PM

## 2011-12-15 NOTE — ED Provider Notes (Signed)
History     CSN: [id number]  Arrival date & time 12/15/11  1423   First MD Initiated Contact with Patient 12/15/11 1510      Chief Complaint  Patient presents with  . Abdominal Pain    (Consider location/radiation/quality/duration/timing/severity/associated sxs/prior treatment) Patient is a 46 y.o. male presenting with abdominal pain. The history is provided by the patient.  Abdominal Pain The primary symptoms of the illness include abdominal pain, nausea and vomiting. The primary symptoms of the illness do not include shortness of breath or diarrhea.  Additional symptoms associated with the illness include constipation. Symptoms associated with the illness do not include back pain.   patient began to have abdominal pain with sudden abdomen on Tuesday. As a history of multiple abdominal surgeries after sigmoid colon resection for perforated diverticulitis. No fevers. He has been vomiting some. Is not passing gas or stool. He saw his primary care Dr. who did an x-ray and told him he has a small bowel obstruction. He came in for further management. Is constant. It is not little bit.  Past Medical History  Diagnosis Date  . GERD (gastroesophageal reflux disease)   . Gout   . Diverticulitis     Past Surgical History  Procedure Date  . Colectomy 2007    sigmoid colon resection and colostomy  . Colon surgery 2008    colostomy closure  . Colon surgery 2008    resection of enterocutaneous fistula, small bowel resection, L colon resection with primary anastomosis, abd wall resection  . Appendectomy 2008  . Abdominal debridement 2009    Family History  Problem Relation Age of Onset  . Arthritis Other   . Hypertension Other   . Coronary artery disease Other     History  Substance Use Topics  . Smoking status: Current Everyday Smoker -- 20.0 packs/day for 20 years    Types: Cigarettes  . Smokeless tobacco: Never Used  . Alcohol Use: No      Review of Systems    Constitutional: Negative for activity change and appetite change.  HENT: Negative for neck stiffness.   Eyes: Negative for pain.  Respiratory: Negative for chest tightness and shortness of breath.   Cardiovascular: Negative for chest pain and leg swelling.  Gastrointestinal: Positive for nausea, vomiting, abdominal pain and constipation. Negative for diarrhea.  Genitourinary: Negative for flank pain.  Musculoskeletal: Negative for back pain.  Skin: Negative for rash.  Neurological: Negative for weakness, numbness and headaches.  Psychiatric/Behavioral: Negative for behavioral problems.    Allergies  Review of patient's allergies indicates no known allergies.  Home Medications  No current outpatient prescriptions on file.  BP 132/78  Pulse 86  Temp(Src) 101.8 F (38.8 C) (Oral)  Resp 18  Wt 191 lb (86.637 kg)  SpO2 98%  Physical Exam  Nursing note and vitals reviewed. Constitutional: He is oriented to person, place, and time. He appears well-developed and well-nourished.  HENT:  Head: Normocephalic and atraumatic.  Eyes: EOM are normal. Pupils are equal, round, and reactive to light.  Neck: Normal range of motion. Neck supple.  Cardiovascular: Normal rate, regular rhythm and normal heart sounds.   No murmur heard. Pulmonary/Chest: Effort normal and breath sounds normal.  Abdominal: Soft. Bowel sounds are normal. He exhibits no distension and no mass. There is tenderness. There is no rebound and no guarding.       Moderate tenderness to left abdomen. Some fullness. Left lower quadrant scar from previous colostomy. Patient is post umbelectomy.  Musculoskeletal: Normal range of motion. He exhibits no edema.  Neurological: He is alert and oriented to person, place, and time. No cranial nerve deficit.  Skin: Skin is warm and dry.  Psychiatric: He has a normal mood and affect.    ED Course  Procedures (including critical care time)  Labs Reviewed  CBC - Abnormal; Notable  for the following:    WBC 14.9 (*)    All other components within normal limits  DIFFERENTIAL - Abnormal; Notable for the following:    Neutrophils Relative 78 (*)    Neutro Abs 11.6 (*)    Monocytes Absolute 1.2 (*)    All other components within normal limits  COMPREHENSIVE METABOLIC PANEL - Abnormal; Notable for the following:    GFR calc non Af Amer 71 (*)    GFR calc Af Amer 82 (*)    All other components within normal limits  LIPASE, BLOOD   Ct Abdomen Pelvis W Contrast  12/15/2011  *RADIOLOGY REPORT*  Clinical Data: Left-sided abdominal pain, history of diverticulitis and colon surgery, evaluate for small bowel obstruction  CT ABDOMEN AND PELVIS WITH CONTRAST  Technique:  Multidetector CT imaging of the abdomen and pelvis was performed following the standard protocol during bolus administration of intravenous contrast.  Contrast: OMNIPAQUE IOHEXOL 300 MG/ML IV SOLN  Comparison: [facility] CT abdomen/pelvis dated 02/20/2009  Findings: Dependent atelectasis at the lung bases.  Three hypoenhancing lesions in the posterior segment right hepatic lobe (series 2/images 24 and 31) and left hepatic dome (series 2/image 11), likely benign.  Spleen, pancreas, and adrenal glands are within normal limits.  Gallbladder is unremarkable.  No intrahepatic or extrahepatic ductal dilatation.  Kidneys within normal limits.  No hydronephrosis.  No evidence of bowel obstruction.  Prior bowel resection with suture lines in the right abdomen.  Colonic diverticulosis with surrounding inflammatory changes involving a loop of descending colon (series 2/image 30), compatible with diverticulitis.  No drainable fluid collection or abscess.  No free air.  Atherosclerotic calcifications of the abdominal aorta and branch vessels.  No abdominopelvic ascites.  No suspicious abdominopelvic lymphadenopathy.  Prostate is unremarkable.  Bladder is within normal limits.  Mild degenerative changes of the visualized  thoracolumbar spine.  IMPRESSION: Descending colonic diverticulitis.  No drainable fluid collection or abscess.  No free air.  No evidence of bowel obstruction.  Prior bowel resection in the right abdomen.  Original Report Authenticated By: Charline Bills, M.D.   Dg Abd Acute W/chest  12/15/2011  *RADIOLOGY REPORT*  Clinical Data: Left lower quadrant pain  ACUTE ABDOMEN SERIES (ABDOMEN 2 VIEW & CHEST 1 VIEW)  Comparison: 02/20/2009  Findings: Heart size is normal.  No pleural effusion or pulmonary edema.  No airspace consolidation identified.  There are dilated loops of small bowel identified.  These measure up to 4 cm.  Within the right lower quadrant of the abdomen there are several air-fluid levels identified.  IMPRESSION:  1.  No active cardiopulmonary abnormalities.  2.  Small bowel dilatation with air-fluid levels suspicious for bowel obstruction.  Original Report Authenticated By: Rosealee Albee, M.D.     1. Diverticulitis       MDM  Sent in by primary care Dr. without extra diagnosis of bowel obstruction. Patient has elevated white count. CT was done and shows diverticulitis without obstruction. He developed fever. He'll be admitted to the hospital.        Juliet Rude. Rubin Payor, MD 12/15/11 [phone]

## 2011-12-15 NOTE — ED Notes (Signed)
JYN:[id number]<AO> Expected date:<BR> Expected time:<BR> Means of arrival:<BR> Comments:<BR> Hold for TR1

## 2011-12-16 ENCOUNTER — Encounter (HOSPITAL_COMMUNITY): Payer: Self-pay | Admitting: General Surgery

## 2011-12-16 DIAGNOSIS — K5732 Diverticulitis of large intestine without perforation or abscess without bleeding: Secondary | ICD-10-CM

## 2011-12-16 DIAGNOSIS — E871 Hypo-osmolality and hyponatremia: Secondary | ICD-10-CM | POA: Diagnosis present

## 2011-12-16 LAB — COMPREHENSIVE METABOLIC PANEL
ALT: 12 U/L (ref 0–53)
Albumin: 4.4 g/dL (ref 3.5–5.2)
Albumin: 3.2 g/dL — ABNORMAL LOW (ref 3.5–5.2)
Alkaline Phosphatase: 59 U/L (ref 39–117)
BUN: 14 mg/dL (ref 6–23)
CO2: 27 mEq/L (ref 19–32)
Calcium: 9.5 mg/dL (ref 8.4–10.5)
Chloride: 100 mEq/L (ref 96–112)
Chloride: 100 mEq/L (ref 96–112)
Creatinine, Ser: 1.16 mg/dL (ref 0.50–1.35)
GFR calc Af Amer: 86 mL/min — ABNORMAL LOW (ref >90)
GFR calc non Af Amer: 74 mL/min — ABNORMAL LOW (ref >90)
GFR: 59.45 mL/min — ABNORMAL LOW (ref >60.00)
Glucose, Bld: 86 mg/dL (ref 70–99)
Potassium: 4.3 mEq/L (ref 3.5–5.1)
Sodium: 135 mEq/L (ref 135–145)
Total Bilirubin: 0.9 mg/dL (ref 0.3–1.2)
Total Protein: 7.3 g/dL (ref 6.0–8.3)

## 2011-12-16 LAB — PROTIME-INR: Prothrombin Time: 15 seconds (ref 11.6–15.2)

## 2011-12-16 LAB — URINALYSIS, ROUTINE W REFLEX MICROSCOPIC
Glucose, UA: NEGATIVE mg/dL
Leukocytes, UA: NEGATIVE
Specific Gravity, Urine: 1.027 (ref 1.005–1.030)
Urobilinogen, UA: 1 mg/dL (ref 0.0–1.0)

## 2011-12-16 LAB — URINE MICROSCOPIC-ADD ON

## 2011-12-16 LAB — CBC
Hemoglobin: 13.4 g/dL (ref 13.0–17.0)
MCH: 33.4 pg (ref 26.0–34.0)
MCV: 93.8 fL (ref 78.0–100.0)
RBC: 4.01 MIL/uL — ABNORMAL LOW (ref 4.22–5.81)

## 2011-12-16 LAB — DIFFERENTIAL
Eosinophils Absolute: 0.1 10*3/uL (ref 0.0–0.7)
Eosinophils Relative: 1 % (ref 0–5)
Lymphs Abs: 1.5 10*3/uL (ref 0.7–4.0)
Monocytes Relative: 9 % (ref 3–12)

## 2011-12-16 LAB — URINE CULTURE: Culture: NO GROWTH

## 2011-12-16 MED ORDER — PIPERACILLIN-TAZOBACTAM 3.375 G IVPB
3.3750 g | Freq: Three times a day (TID) | INTRAVENOUS | Status: DC
  Administered 2011-12-16 – 2011-12-19 (×11): 3.375 g via INTRAVENOUS
  Filled 2011-12-16 (×14): qty 50

## 2011-12-16 MED ORDER — PANTOPRAZOLE SODIUM 40 MG IV SOLR
40.0000 mg | Freq: Every day | INTRAVENOUS | Status: DC
  Administered 2011-12-16 – 2011-12-18 (×4): 40 mg via INTRAVENOUS
  Filled 2011-12-16 (×5): qty 40

## 2011-12-16 MED ORDER — ENOXAPARIN SODIUM 40 MG/0.4ML ~~LOC~~ SOLN
40.0000 mg | SUBCUTANEOUS | Status: DC
  Administered 2011-12-16 – 2011-12-19 (×4): 40 mg via SUBCUTANEOUS
  Filled 2011-12-16 (×4): qty 0.4

## 2011-12-16 MED ORDER — ALUM & MAG HYDROXIDE-SIMETH 200-200-20 MG/5ML PO SUSP: 30.0000 mL | Freq: Four times a day (QID) | ORAL | Status: DC | PRN

## 2011-12-16 MED ORDER — SODIUM CHLORIDE 0.9 % IV SOLN
INTRAVENOUS | Status: DC
  Administered 2011-12-16 – 2011-12-17 (×3): via INTRAVENOUS

## 2011-12-16 MED ORDER — ONDANSETRON HCL 4 MG/2ML IJ SOLN: 4.0000 mg | Freq: Four times a day (QID) | INTRAMUSCULAR | Status: DC | PRN

## 2011-12-16 MED ORDER — OXYCODONE HCL 5 MG PO TABS
5.0000 mg | ORAL_TABLET | ORAL | Status: DC | PRN
  Administered 2011-12-16 – 2011-12-17 (×2): 5 mg via ORAL
  Filled 2011-12-16 (×2): qty 1

## 2011-12-16 MED ORDER — ONDANSETRON HCL 4 MG PO TABS: 4.0000 mg | ORAL_TABLET | Freq: Four times a day (QID) | ORAL | Status: DC | PRN

## 2011-12-16 MED ORDER — SENNOSIDES-DOCUSATE SODIUM 8.6-50 MG PO TABS
1.0000 | ORAL_TABLET | Freq: Every evening | ORAL | Status: DC | PRN
  Filled 2011-12-16: qty 1

## 2011-12-16 MED ORDER — MORPHINE SULFATE 2 MG/ML IJ SOLN
2.0000 mg | INTRAMUSCULAR | Status: DC | PRN
  Administered 2011-12-16: 2 mg via INTRAVENOUS
  Filled 2011-12-16: qty 1

## 2011-12-16 MED ORDER — ACETAMINOPHEN 650 MG RE SUPP: 650.0000 mg | Freq: Four times a day (QID) | RECTAL | Status: DC | PRN

## 2011-12-16 MED ORDER — ACETAMINOPHEN 325 MG PO TABS: 650.0000 mg | ORAL_TABLET | Freq: Four times a day (QID) | ORAL | Status: DC | PRN

## 2011-12-16 NOTE — ED Notes (Signed)
Flagyl hung

## 2011-12-16 NOTE — ED Notes (Signed)
Flagyl hung to infuse after the the cipro finished.  Family at bedside.  patietn sleeping from sedation

## 2011-12-16 NOTE — Progress Notes (Signed)
CARE MANAGEMENT NOTE 12/16/2011  Patient:  Christopher Guerrero, Christopher Guerrero   Account Number:  [id number]  Date Initiated:  12/16/2011  Documentation initiated by:  PEARSON,COOKIE  Subjective/Objective Assessment:   pt admitted with cco abd pain, N,V,     Action/Plan:   from home   Anticipated DC Date:  12/19/2011   Anticipated DC Plan:  HOME/SELF CARE  In-house referral  NA      DC Planning Services  NA      [facility] Choice  NA   Choice offered to / List presented to:     DME arranged  NA      DME agency  NA     HH arranged  NA      HH agency  NA   Status of service:  In process, will continue to follow Medicare Important Message given?  NO (If response is "NO", the following Medicare IM given date fields will be blank) Date Medicare IM given:   Date Additional Medicare IM given:    Discharge Disposition:  HOME/SELF CARE  Per UR Regulation:  Reviewed for med. necessity/level of care/duration of stay  Comments:  12/16/11 MPearson, RN, BSN Pt states that he plan to discharge home with no needs at present time.

## 2011-12-16 NOTE — ED Notes (Signed)
Spouse called and notified of the room assignment as requested

## 2011-12-16 NOTE — Consult Note (Signed)
Christopher Guerrero [birth year]  [id number].   Primary Care MD: Dr. Yetta Barre Requesting MD: Dr. Ramiro Harvest Chief Complaint/Reason for Consult: diverticulitis HPI: This is a 46 yo WM who has a significant history of diverticulitis with multiple abdominal operations.  This past Tuesday he began having some abdominal discomfort.  He also had associated nausea and vomiting.  He thought maybe he had a virus, but over the next several days it did not improve.  He did run a low grade fever of 100 at home.  Due to worsening pain, he saw his PCP yesterday who took an x-ray.  He felt he may have a blockage and sent him to the ED.  Upon arrival, he had a CT scan that revealed diverticulitis, uncomplicated, with no evidence of abscess or microperforation.  We have been asked to evaluate for further recommendations.  Review of Systems: Please see HPI; otherwise all other systems have been reviewed and are negative.   Family History  Problem Relation Age of Onset  . Arthritis Other   . Hypertension Other   . Coronary artery disease Other     Past Medical History  Diagnosis Date  . GERD (gastroesophageal reflux disease)   . Gout   . Diverticulitis     Past Surgical History  Procedure Date  . Colectomy 2007    sigmoid colon resection and colostomy  . Colon surgery 2008    colostomy closure  . Colon surgery 2008    resection of enterocutaneous fistula, small bowel resection, L colon resection with primary anastomosis, abd wall resection  . Appendectomy 2008  . Abdominal debridement 2009    Social History:  reports that he has been smoking Cigarettes.  He has a 400 pack-year smoking history. He has never used smokeless tobacco. He reports that he does not drink alcohol or use illicit drugs.  Allergies: No Known Allergies  Medications Prior to Admission  Medication Dose Route Frequency Provider Last Rate Last Dose  . 0.9 %  sodium chloride infusion   Intravenous Continuous Juliet Rude. Pickering, MD  125 mL/hr at 12/15/11 1743 1,000 mL at 12/15/11 1743  . 0.9 %  sodium chloride infusion   Intravenous Continuous Ramiro Harvest, MD 125 mL/hr at 12/16/11 0217    . acetaminophen (TYLENOL) tablet 650 mg  650 mg Oral Q6H PRN Ramiro Harvest, MD       Or  . acetaminophen (TYLENOL) suppository 650 mg  650 mg Rectal Q6H PRN Ramiro Harvest, MD      . acetaminophen (TYLENOL) tablet 650 mg  650 mg Oral Once [employer]. Pickering, MD   650 mg at 12/15/11 1759  . alum & mag hydroxide-simeth (MAALOX/MYLANTA) 200-200-20 MG/5ML suspension 30 mL  30 mL Oral Q6H PRN Ramiro Harvest, MD      . ciprofloxacin (CIPRO) IVPB 400 mg  400 mg Intravenous Once Harrold Donath R. Pickering, MD   400 mg at 12/15/11 1902  . enoxaparin (LOVENOX) injection 40 mg  40 mg Subcutaneous Q24H Ramiro Harvest, MD      . iohexol (OMNIPAQUE) 300 MG/ML solution 100 mL  100 mL Intravenous Once PRN Medication Radiologist, MD   100 mL at 12/15/11 1800  . metroNIDAZOLE (FLAGYL) IVPB 500 mg  500 mg Intravenous Once Harrold Donath R. Pickering, MD   500 mg at 12/15/11 2045  . morphine 2 MG/ML injection 2 mg  2 mg Intravenous Q4H PRN Ramiro Harvest, MD   2 mg at 12/16/11 0217  . morphine 4 MG/ML injection 4 mg  4 mg Intravenous Once [employer]. Pickering, MD   4 mg at 12/15/11 1551  . morphine 4 MG/ML injection 4 mg  4 mg Intravenous Once [employer]. Pickering, MD   4 mg at 12/15/11 1800  . ondansetron (ZOFRAN) injection 4 mg  4 mg Intravenous Once [employer]. Pickering, MD   4 mg at 12/15/11 1549  . ondansetron (ZOFRAN) tablet 4 mg  4 mg Oral Q6H PRN Ramiro Harvest, MD       Or  . ondansetron [facility]) injection 4 mg  4 mg Intravenous Q6H PRN Ramiro Harvest, MD      . oxyCODONE (Oxy IR/ROXICODONE) immediate release tablet 5 mg  5 mg Oral Q4H PRN Ramiro Harvest, MD      . pantoprazole (PROTONIX) injection 40 mg  40 mg Intravenous QHS Ramiro Harvest, MD   40 mg at 12/16/11 0216  . piperacillin-tazobactam (ZOSYN) IVPB 3.375 g  3.375 g Intravenous Q8H Ramiro Harvest, MD   3.375 g at 12/16/11 0216  . senna-docusate (Senokot-S) tablet 1 tablet  1 tablet Oral QHS PRN Ramiro Harvest, MD      . sodium chloride 0.9 % bolus 500 mL  500 mL Intravenous Once Harrold Donath R. Pickering, MD   500 mL at 12/15/11 1548   No current outpatient prescriptions on file as of 12/16/2011.    Blood pressure 132/78, pulse 86, temperature 101.8 F (38.8 C), temperature source Oral, resp. rate 18, height 5\' 11"  (1.803 m), weight 189 lb (85.73 kg), SpO2 98.00%. Physical Exam: General: pleasant, WD, WN, 46 yo WM who is laying in bed in NAD HEENT: head is normocephalic, atraumatic.  Sclera are noninjected. PERRL.  Ears and nose without any masses or lesions.  No rhinorrhea.  Mouth is pink  Heart: regular rate and rhythm.  Normal s1,s2.  No murmurs, gallops, or rubs.  Palpable radial and pedal pulses bilaterally. Lungs: CTAB.  No wheezes, rhonchi, or rales noted.  Respiratory effort is nonlabored. Abd: soft, tender in the epigastrum extending into the LUQ and down his left abdomen into his LLQ.  He has some voluntary guarding with palpation mostly in the LLQ.  +BS, ND.  He has multiple scars noted from his prior abdominal operations.  No masses, organomegaly noted. MS: all 4 extremities are symmetrical. No cyanosis, clubbing, or edema Skin: warm and dry with no masses, lesions, or rashes Psych: A&O x 3. Appropriate affect.   Results for orders placed during the hospital encounter of 12/15/11 (from the past 48 hour(s))  CBC     Status: Abnormal   Collection Time   12/15/11  3:39 PM      Component Value Range Comment   WBC 14.9 (*) 4.0 - 10.5 (K/uL)    RBC 4.67  4.22 - 5.81 (MIL/uL)    Hemoglobin 15.3  13.0 - 17.0 (g/dL)    HCT 16.1  09.6 - 04.5 (%)    MCV 93.8  78.0 - 100.0 (fL)    MCH 32.8  26.0 - 34.0 (pg)    MCHC 34.9  30.0 - 36.0 (g/dL)    RDW 40.9  81.1 - 91.4 (%)    Platelets 172  150 - 400 (K/uL)   DIFFERENTIAL     Status: Abnormal   Collection Time   12/15/11  3:39  PM      Component Value Range Comment   Neutrophils Relative 78 (*) 43 - 77 (%)    Neutro Abs 11.6 (*) 1.7 - 7.7 (K/uL)  Lymphocytes Relative 13  12 - 46 (%)    Lymphs Abs 2.0  0.7 - 4.0 (K/uL)    Monocytes Relative 8  3 - 12 (%)    Monocytes Absolute 1.2 (*) 0.1 - 1.0 (K/uL)    Eosinophils Relative 0  0 - 5 (%)    Eosinophils Absolute 0.0  0.0 - 0.7 (K/uL)    Basophils Relative 0  0 - 1 (%)    Basophils Absolute 0.0  0.0 - 0.1 (K/uL)   COMPREHENSIVE METABOLIC PANEL     Status: Abnormal   Collection Time   12/15/11  3:39 PM      Component Value Range Comment   Sodium 135  135 - 145 (mEq/L)    Potassium 4.3  3.5 - 5.1 (mEq/L)    Chloride 99  96 - 112 (mEq/L)    CO2 24  19 - 32 (mEq/L)    Glucose, Bld 93  70 - 99 (mg/dL)    BUN 15  6 - 23 (mg/dL)    Creatinine, Ser 0.34  0.50 - 1.35 (mg/dL)    Calcium 74.2  8.4 - 10.5 (mg/dL)    Total Protein 7.5  6.0 - 8.3 (g/dL)    Albumin 4.0  3.5 - 5.2 (g/dL)    AST 13  0 - 37 (U/L)    ALT 8  0 - 53 (U/L)    Alkaline Phosphatase 87  39 - 117 (U/L)    Total Bilirubin 0.9  0.3 - 1.2 (mg/dL)    GFR calc non Af Amer 71 (*) >90 (mL/min)    GFR calc Af Amer 82 (*) >90 (mL/min)   LIPASE, BLOOD     Status: Normal   Collection Time   12/15/11  3:39 PM      Component Value Range Comment   Lipase 17  11 - 59 (U/L)   MAGNESIUM     Status: Normal   Collection Time   12/16/11  4:19 AM      Component Value Range Comment   Magnesium 1.7  1.5 - 2.5 (mg/dL)   CBC     Status: Abnormal   Collection Time   12/16/11  4:19 AM      Component Value Range Comment   WBC 11.9 (*) 4.0 - 10.5 (K/uL)    RBC 4.01 (*) 4.22 - 5.81 (MIL/uL)    Hemoglobin 13.4  13.0 - 17.0 (g/dL)    HCT 59.5 (*) 63.8 - 52.0 (%)    MCV 93.8  78.0 - 100.0 (fL)    MCH 33.4  26.0 - 34.0 (pg)    MCHC 35.6  30.0 - 36.0 (g/dL)    RDW 75.6  43.3 - 29.5 (%)    Platelets 139 (*) 150 - 400 (K/uL)   DIFFERENTIAL     Status: Abnormal   Collection Time   12/16/11  4:19 AM      Component Value  Range Comment   Neutrophils Relative 77  43 - 77 (%)    Neutro Abs 9.2 (*) 1.7 - 7.7 (K/uL)    Lymphocytes Relative 13  12 - 46 (%)    Lymphs Abs 1.5  0.7 - 4.0 (K/uL)    Monocytes Relative 9  3 - 12 (%)    Monocytes Absolute 1.1 (*) 0.1 - 1.0 (K/uL)    Eosinophils Relative 1  0 - 5 (%)    Eosinophils Absolute 0.1  0.0 - 0.7 (K/uL)    Basophils Relative 0  0 - 1 (%)  Basophils Absolute 0.0  0.0 - 0.1 (K/uL)   APTT     Status: Abnormal   Collection Time   12/16/11  4:19 AM      Component Value Range Comment   aPTT 42 (*) 24 - 37 (seconds)   PROTIME-INR     Status: Normal   Collection Time   12/16/11  4:19 AM      Component Value Range Comment   Prothrombin Time 15.0  11.6 - 15.2 (seconds)    INR 1.16  0.00 - 1.49    COMPREHENSIVE METABOLIC PANEL     Status: Abnormal   Collection Time   12/16/11  4:19 AM      Component Value Range Comment   Sodium 131 (*) 135 - 145 (mEq/L)    Potassium 3.7  3.5 - 5.1 (mEq/L)    Chloride 100  96 - 112 (mEq/L)    CO2 20  19 - 32 (mEq/L)    Glucose, Bld 86  70 - 99 (mg/dL)    BUN 14  6 - 23 (mg/dL)    Creatinine, Ser 4.09  0.50 - 1.35 (mg/dL)    Calcium 8.9  8.4 - 10.5 (mg/dL)    Total Protein 6.2  6.0 - 8.3 (g/dL)    Albumin 3.2 (*) 3.5 - 5.2 (g/dL)    AST 9  0 - 37 (U/L)    ALT 6  0 - 53 (U/L)    Alkaline Phosphatase 59  39 - 117 (U/L)    Total Bilirubin 0.9  0.3 - 1.2 (mg/dL)    GFR calc non Af Amer 74 (*) >90 (mL/min)    GFR calc Af Amer 86 (*) >90 (mL/min)    Ct Abdomen Pelvis W Contrast  12/15/2011  *RADIOLOGY REPORT*  Clinical Data: Left-sided abdominal pain, history of diverticulitis and colon surgery, evaluate for small bowel obstruction  CT ABDOMEN AND PELVIS WITH CONTRAST  Technique:  Multidetector CT imaging of the abdomen and pelvis was performed following the standard protocol during bolus administration of intravenous contrast.  Contrast: OMNIPAQUE IOHEXOL 300 MG/ML IV SOLN  Comparison: [facility] CT abdomen/pelvis  dated 02/20/2009  Findings: Dependent atelectasis at the lung bases.  Three hypoenhancing lesions in the posterior segment right hepatic lobe (series 2/images 24 and 31) and left hepatic dome (series 2/image 11), likely benign.  Spleen, pancreas, and adrenal glands are within normal limits.  Gallbladder is unremarkable.  No intrahepatic or extrahepatic ductal dilatation.  Kidneys within normal limits.  No hydronephrosis.  No evidence of bowel obstruction.  Prior bowel resection with suture lines in the right abdomen.  Colonic diverticulosis with surrounding inflammatory changes involving a loop of descending colon (series 2/image 30), compatible with diverticulitis.  No drainable fluid collection or abscess.  No free air.  Atherosclerotic calcifications of the abdominal aorta and branch vessels.  No abdominopelvic ascites.  No suspicious abdominopelvic lymphadenopathy.  Prostate is unremarkable.  Bladder is within normal limits.  Mild degenerative changes of the visualized thoracolumbar spine.  IMPRESSION: Descending colonic diverticulitis.  No drainable fluid collection or abscess.  No free air.  No evidence of bowel obstruction.  Prior bowel resection in the right abdomen.  Original Report Authenticated By: Charline Bills, M.D.   Dg Abd Acute W/chest  12/15/2011  *RADIOLOGY REPORT*  Clinical Data: Left lower quadrant pain  ACUTE ABDOMEN SERIES (ABDOMEN 2 VIEW & CHEST 1 VIEW)  Comparison: 02/20/2009  Findings: Heart size is normal.  No pleural effusion or pulmonary edema.  No  airspace consolidation identified.  There are dilated loops of small bowel identified.  These measure up to 4 cm.  Within the right lower quadrant of the abdomen there are several air-fluid levels identified.  IMPRESSION:  1.  No active cardiopulmonary abnormalities.  2.  Small bowel dilatation with air-fluid levels suspicious for bowel obstruction.  Original Report Authenticated By: Rosealee Albee, M.D.       Assessment/Plan 1.  Descending colonic diverticulitis 2. H/o complicated diverticulitis with multiple operations  Plan: 1.  Currently, the patient has uncomplicated diverticulitis with no evidence of perforation or abscess.  We agree with IVF hydration and the use of Zosyn for IV abx therapy.  For now, given his current pain level, we would recommend NPO status and bowel rest.  Once his pain begins to improve, then hopefully we can advance his diet.  If by some chance the patient worsens, he may need a repeat CT scan in 5-7 days; however, if he continues to improve he will not require this.  Hopefully, we can avoid any further surgical intervention and then patient will improve and resolve with conservative management only.  Thank you for this consultation.  We will follow along with you. Keia Rask E 12/16/2011, 7:19 AM

## 2011-12-16 NOTE — Progress Notes (Signed)
PATIENT DETAILS Name: Christopher Guerrero Age: 46 y.o. Sex: male Date of Birth: 03-27-1966 Admit Date: 12/15/2011 [id number] Yetta Barre, MD, MD Emergency contact:   CONSULTS: 1.  Dr. Abigail Miyamoto, Surgery  Interval History: Christopher Guerrero is a 46 year old gentleman with a history of gastroesophageal reflux disease, history of perforated diverticulitis status post 2 stage colonic resection 09/09/2006, history of enterocutaneous fistula and colo-cutaneous fistula with abdominal wall abscess status post expiratory laparotomy with resection of enterocutaneous fistula segmental small bowel resection segmental left colon resection with primary anastomosis resection of abdominal wall including skin subcutaneous tissue muscle and fascia with indicated appendectomy and complex abdominal wall closure. Patient also had an extensive hospitalization for this enterocutaneous fistula in 2008. He presented to the ED on 12/15/11 with a three-day history of sudden onset of left lower quadrant abdominal pain, nausea, emesis, fever with a temperature of 100, chills, generalized weakness and no BM x 2 days.   ROS: Christopher Guerrero feels better.  No current abdominal pain.  Passing flatus.  No N/V over night.   Objective: Vital signs in last 24 hours: Temp:  [101.8 F (38.8 C)] 101.8 F (38.8 C) (01/17 1744) Pulse Rate:  [86] 86  (01/17 1744) Resp:  [18] 18  (01/17 1744) BP: (132)/(78) 132/78 mmHg (01/17 1744) SpO2:  [98 %] 98 % (01/17 1744) Weight:  [85.73 kg (189 lb)] 85.73 kg (189 lb) (01/18 0145) Weight change:  Last BM Date: 12/13/11  Intake/Output from previous day:  Intake/Output Summary (Last 24 hours) at 12/16/11 1459 Last data filed at 12/16/11 1100  Gross per 24 hour  Intake     50 ml  Output    100 ml  Net    -50 ml     Physical Exam:  Gen:  NAD Cardiovascular:  RRR, No M/R/G Respiratory: Lungs CTAB Gastrointestinal: Abdomen soft, ND with normal active bowel sounds.  Tender LLQ but no rebound  or guarding. Extremities: No C/E/C    Lab Results: Basic Metabolic Panel:  Lab 12/16/11 4098 12/15/11 1539 12/15/11 1202  NA 131* 135 135  K 3.7 4.3 --  CL 100 99 100  CO2 20 24 27   GLUCOSE 86 93 91  BUN 14 15 16   CREATININE 1.16 1.20 1.4  CALCIUM 8.9 10.1 9.5  MG 1.7 -- --  PHOS -- -- --   GFR Estimated Creatinine Clearance: 84.7 ml/min (by C-G formula based on Cr of 1.16). Liver Function Tests:  Lab 12/16/11 0419 12/15/11 1539 12/15/11 1202  AST 9 13 16   ALT 6 8 12   ALKPHOS 59 87 61  BILITOT 0.9 0.9 1.1  PROT 6.2 7.5 7.3  ALBUMIN 3.2* 4.0 4.4    Lab 12/15/11 1539  LIPASE 17  AMYLASE --   No results found for this basename: AMMONIA:5 in the last 168 hours Coagulation profile  Lab 12/16/11 0419  INR 1.16  PROTIME --    CBC:  Lab 12/16/11 0419 12/15/11 1539 12/15/11 1202  WBC 11.9* 14.9* 16.5*  NEUTROABS 9.2* 11.6* 13.2*  HGB 13.4 15.3 16.1  HCT 37.6* 43.8 47.1  MCV 93.8 93.8 98.5  PLT 139* 172 189.0    Studies/Results: Ct Abdomen Pelvis W Contrast  12/15/2011  *RADIOLOGY REPORT*  Clinical Data: Left-sided abdominal pain, history of diverticulitis and colon surgery, evaluate for small bowel obstruction  CT ABDOMEN AND PELVIS WITH CONTRAST  Technique:  Multidetector CT imaging of the abdomen and pelvis was performed following the standard protocol during bolus administration of intravenous contrast.  Contrast:  OMNIPAQUE IOHEXOL 300 MG/ML IV SOLN  Comparison: [facility] CT abdomen/pelvis dated 02/20/2009  Findings: Dependent atelectasis at the lung bases.  Three hypoenhancing lesions in the posterior segment right hepatic lobe (series 2/images 24 and 31) and left hepatic dome (series 2/image 11), likely benign.  Spleen, pancreas, and adrenal glands are within normal limits.  Gallbladder is unremarkable.  No intrahepatic or extrahepatic ductal dilatation.  Kidneys within normal limits.  No hydronephrosis.  No evidence of bowel obstruction.  Prior  bowel resection with suture lines in the right abdomen.  Colonic diverticulosis with surrounding inflammatory changes involving a loop of descending colon (series 2/image 30), compatible with diverticulitis.  No drainable fluid collection or abscess.  No free air.  Atherosclerotic calcifications of the abdominal aorta and branch vessels.  No abdominopelvic ascites.  No suspicious abdominopelvic lymphadenopathy.  Prostate is unremarkable.  Bladder is within normal limits.  Mild degenerative changes of the visualized thoracolumbar spine.  IMPRESSION: Descending colonic diverticulitis.  No drainable fluid collection or abscess.  No free air.  No evidence of bowel obstruction.  Prior bowel resection in the right abdomen.  Original Report Authenticated By: Charline Bills, M.D.   Dg Abd Acute W/chest  12/15/2011  *RADIOLOGY REPORT*  Clinical Data: Left lower quadrant pain  ACUTE ABDOMEN SERIES (ABDOMEN 2 VIEW & CHEST 1 VIEW)  Comparison: 02/20/2009  Findings: Heart size is normal.  No pleural effusion or pulmonary edema.  No airspace consolidation identified.  There are dilated loops of small bowel identified.  These measure up to 4 cm.  Within the right lower quadrant of the abdomen there are several air-fluid levels identified.  IMPRESSION:  1.  No active cardiopulmonary abnormalities.  2.  Small bowel dilatation with air-fluid levels suspicious for bowel obstruction.  Original Report Authenticated By: Rosealee Albee, M.D.    Medications: Scheduled Meds:    . acetaminophen  650 mg Oral Once  . ciprofloxacin  400 mg Intravenous Once  . enoxaparin  40 mg Subcutaneous Q24H  . metronidazole  500 mg Intravenous Once  .  morphine injection  4 mg Intravenous Once  .  morphine injection  4 mg Intravenous Once  . ondansetron  4 mg Intravenous Once  . pantoprazole (PROTONIX) IV  40 mg Intravenous QHS  . piperacillin-tazobactam (ZOSYN)  IV  3.375 g Intravenous Q8H  . sodium chloride  500 mL Intravenous Once     Continuous Infusions:    . sodium chloride 1,000 mL (12/15/11 1743)  . sodium chloride 125 mL/hr at 12/16/11 0217   PRN Meds:.acetaminophen, acetaminophen, alum & mag hydroxide-simeth, iohexol, morphine, ondansetron (ZOFRAN) IV, ondansetron, oxyCODONE, DISCONTD: senna-docusate Antibiotics: Anti-infectives     Start     Dose/Rate Route Frequency Ordered Stop   12/16/11 0200   piperacillin-tazobactam (ZOSYN) IVPB 3.375 g        3.375 g 12.5 mL/hr over 240 Minutes Intravenous Every 8 hours 12/16/11 0134     12/15/11 1845   ciprofloxacin (CIPRO) IVPB 400 mg        400 mg 200 mL/hr over 60 Minutes Intravenous  Once 12/15/11 1832 12/15/11 2002   12/15/11 1845   metroNIDAZOLE (FLAGYL) IVPB 500 mg        500 mg 100 mL/hr over 60 Minutes Intravenous  Once 12/15/11 1832 12/15/11 2145           Assessment/Plan:  Principal Problem:  *Diverticulitis The patient was admitted and put on empiric Zosyn.  Surgical consultation was requested and kindly provided  by Dr. Magnus Ivan who felt that the patient had an uncomplicated diverticulitis and did not require surgical intervention.  CT abdomen and pelvis was negative for obstruction/abscess/perforation.  Continue bowel rest, empiric antibiotics. Active Problems:  GERD The patient has been put on IV PPI therapy.  Dehydration The patient is receiving vigorous IVF rehydration.  Hyponatremia The patient is being hydrated with isotonic IVF.    LOS: 1 day   Hillery Aldo, MD Pager [phone]  12/16/2011, 2:59 PM

## 2011-12-16 NOTE — Consult Note (Signed)
I have seen and examined the patient and agree with the assessment and plans. Will continue conservative management  Lashell Moffitt A. Franke Menter  MD, FACS 

## 2011-12-17 LAB — MRSA PCR SCREENING: MRSA by PCR: INVALID — AB

## 2011-12-17 LAB — CBC
Hemoglobin: 12.5 g/dL — ABNORMAL LOW (ref 13.0–17.0)
MCH: 33.2 pg (ref 26.0–34.0)
MCHC: 35.7 g/dL (ref 30.0–36.0)
Platelets: 130 10*3/uL — ABNORMAL LOW (ref 150–400)
RDW: 12.3 % (ref 11.5–15.5)

## 2011-12-17 LAB — MRSA CULTURE

## 2011-12-17 NOTE — Progress Notes (Signed)
PATIENT DETAILS Name: Christopher Guerrero Age: 46 y.o. Sex: male Date of Birth: [birth year] Admit Date: 12/15/2011 [id number] Yetta Barre, MD, MD Emergency contact:   CONSULTS: 1.  Dr. Abigail Miyamoto, Surgery  Interval History: Christopher Guerrero is a 46 year old gentleman with a history of gastroesophageal reflux disease, history of perforated diverticulitis status post 2 stage colonic resection 09/09/2006, history of enterocutaneous fistula and colo-cutaneous fistula with abdominal wall abscess status post expiratory laparotomy with resection of enterocutaneous fistula segmental small bowel resection segmental left colon resection with primary anastomosis resection of abdominal wall including skin subcutaneous tissue muscle and fascia with indicated appendectomy and complex abdominal wall closure. Patient also had an extensive hospitalization for this enterocutaneous fistula in 2008. He presented to the ED on 12/15/11 with a three-day history of sudden onset of left lower quadrant abdominal pain, nausea, emesis, fever with a temperature of 100, chills, generalized weakness and no BM x 2 days.   ROS: Christopher Guerrero feels better.  No current abdominal pain.  Passing flatus.  No N/V over night.  Had a small loose BM today.   Objective: Vital signs in last 24 hours: Temp:  [98 F (36.7 C)-98.8 F (37.1 C)] 98 F (36.7 C) (01/19 1426) Pulse Rate:  [65-69] 69  (01/19 1426) Resp:  [16-18] 16  (01/19 1426) BP: (116-133)/(79-86) 133/86 mmHg (01/19 1426) SpO2:  [94 %-96 %] 96 % (01/19 1426) Weight change:  Last BM Date: 12/17/11  Intake/Output from previous day:  Intake/Output Summary (Last 24 hours) at 12/17/11 1534 Last data filed at 12/17/11 1300  Gross per 24 hour  Intake 3994.58 ml  Output    700 ml  Net 3294.58 ml     Physical Exam:  Gen:  NAD Cardiovascular:  RRR, No M/R/G Respiratory: Lungs CTAB Gastrointestinal: Abdomen soft, ND with normal active bowel sounds.  Tender LLQ but no rebound  or guarding. Extremities: No C/E/C    Lab Results: Basic Metabolic Panel:  Lab 12/16/11 4696 12/15/11 1539 12/15/11 1202  NA 131* 135 135  K 3.7 4.3 --  CL 100 99 100  CO2 20 24 27   GLUCOSE 86 93 91  BUN 14 15 16   CREATININE 1.16 1.20 1.4  CALCIUM 8.9 10.1 9.5  MG 1.7 -- --  PHOS -- -- --   GFR Estimated Creatinine Clearance: 84.7 ml/min (by C-G formula based on Cr of 1.16). Liver Function Tests:  Lab 12/16/11 0419 12/15/11 1539 12/15/11 1202  AST 9 13 16   ALT 6 8 12   ALKPHOS 59 87 61  BILITOT 0.9 0.9 1.1  PROT 6.2 7.5 7.3  ALBUMIN 3.2* 4.0 4.4    Lab 12/15/11 1539  LIPASE 17  AMYLASE --   Coagulation profile  Lab 12/16/11 0419  INR 1.16  PROTIME --    CBC:  Lab 12/17/11 0458 12/16/11 0419 12/15/11 1539 12/15/11 1202  WBC 7.7 11.9* 14.9* 16.5*  NEUTROABS -- 9.2* 11.6* 13.2*  HGB 12.5* 13.4 15.3 16.1  HCT 35.0* 37.6* 43.8 47.1  MCV 93.1 93.8 93.8 98.5  PLT 130* 139* 172 189.0    Studies/Results: Ct Abdomen Pelvis W Contrast  12/15/2011  *RADIOLOGY REPORT*  Clinical Data: Left-sided abdominal pain, history of diverticulitis and colon surgery, evaluate for small bowel obstruction  CT ABDOMEN AND PELVIS WITH CONTRAST  Technique:  Multidetector CT imaging of the abdomen and pelvis was performed following the standard protocol during bolus administration of intravenous contrast.  Contrast: OMNIPAQUE IOHEXOL 300 MG/ML IV SOLN  Comparison: [facility] CT  abdomen/pelvis dated 02/20/2009  Findings: Dependent atelectasis at the lung bases.  Three hypoenhancing lesions in the posterior segment right hepatic lobe (series 2/images 24 and 31) and left hepatic dome (series 2/image 11), likely benign.  Spleen, pancreas, and adrenal glands are within normal limits.  Gallbladder is unremarkable.  No intrahepatic or extrahepatic ductal dilatation.  Kidneys within normal limits.  No hydronephrosis.  No evidence of bowel obstruction.  Prior bowel resection with suture  lines in the right abdomen.  Colonic diverticulosis with surrounding inflammatory changes involving a loop of descending colon (series 2/image 30), compatible with diverticulitis.  No drainable fluid collection or abscess.  No free air.  Atherosclerotic calcifications of the abdominal aorta and branch vessels.  No abdominopelvic ascites.  No suspicious abdominopelvic lymphadenopathy.  Prostate is unremarkable.  Bladder is within normal limits.  Mild degenerative changes of the visualized thoracolumbar spine.  IMPRESSION: Descending colonic diverticulitis.  No drainable fluid collection or abscess.  No free air.  No evidence of bowel obstruction.  Prior bowel resection in the right abdomen.  Original Report Authenticated By: Charline Bills, M.D.    Medications: Scheduled Meds:    . enoxaparin  40 mg Subcutaneous Q24H  . pantoprazole (PROTONIX) IV  40 mg Intravenous QHS  . piperacillin-tazobactam (ZOSYN)  IV  3.375 g Intravenous Q8H   Continuous Infusions:    . sodium chloride 1,000 mL (12/15/11 1743)  . sodium chloride 125 mL/hr at 12/17/11 1426   PRN Meds:.acetaminophen, acetaminophen, alum & mag hydroxide-simeth, morphine, ondansetron (ZOFRAN) IV, ondansetron, oxyCODONE Antibiotics: Anti-infectives     Start     Dose/Rate Route Frequency Ordered Stop   12/16/11 0200   piperacillin-tazobactam (ZOSYN) IVPB 3.375 g        3.375 g 12.5 mL/hr over 240 Minutes Intravenous Every 8 hours 12/16/11 0134     12/15/11 1845   ciprofloxacin (CIPRO) IVPB 400 mg        400 mg 200 mL/hr over 60 Minutes Intravenous  Once 12/15/11 1832 12/15/11 2002   12/15/11 1845   metroNIDAZOLE (FLAGYL) IVPB 500 mg        500 mg 100 mL/hr over 60 Minutes Intravenous  Once 12/15/11 1832 12/15/11 2145           Assessment/Plan:  Principal Problem:  *Diverticulitis The patient was admitted and put on empiric Zosyn.  Surgical consultation was requested and kindly provided by Dr. Magnus Ivan who felt that the  patient had an uncomplicated diverticulitis and did not require surgical intervention.  CT abdomen and pelvis was negative for obstruction/abscess/perforation.  He is improving with current therapy.  His diet has been advanced to CL.  Continue empiric antibiotics. Active Problems:  GERD The patient has been put on IV PPI therapy.  Dehydration The patient is receiving vigorous IVF rehydration.  Will decrease IVF rate, since he is now taking CL.  Hyponatremia The patient is being hydrated with isotonic IVF.    LOS: 2 days   Hillery Aldo, MD Pager [phone]  12/17/2011, 3:34 PM

## 2011-12-17 NOTE — Progress Notes (Signed)
Subjective: Has less pain this morning.  No nausea.  Had a BM.  Objective: Vital signs in last 24 hours: Temp:  [98.2 F (36.8 C)-98.8 F (37.1 C)] 98.2 F (36.8 C) (01/19 0530) Pulse Rate:  [65-76] 65  (01/19 0530) Resp:  [18] 18  (01/19 0530) BP: (116-132)/(79-91) 116/79 mmHg (01/19 0530) SpO2:  [94 %-95 %] 95 % (01/19 0530) Last BM Date: 12/17/11  Intake/Output from previous day: 01/18 0701 - 01/19 0700 In: 3564.6 [I.V.:3464.6; IV Piggyback:100] Out: 800 [Urine:800] Intake/Output this shift:    PE: Gen-  WDWN in NAD  Abd-soft, multiple scars, mild LLQ tenderness  Lab Results:   Basename 12/17/11 0458 12/16/11 0419  WBC 7.7 11.9*  HGB 12.5* 13.4  HCT 35.0* 37.6*  PLT 130* 139*   BMET  Basename 12/16/11 0419 12/15/11 1539  NA 131* 135  K 3.7 4.3  CL 100 99  CO2 20 24  GLUCOSE 86 93  BUN 14 15  CREATININE 1.16 1.20  CALCIUM 8.9 10.1   PT/INR  Basename 12/16/11 0419  LABPROT 15.0  INR 1.16   Comprehensive Metabolic Panel:    Component Value Date/Time   NA 131* 12/16/2011 0419   K 3.7 12/16/2011 0419   CL 100 12/16/2011 0419   CO2 20 12/16/2011 0419   BUN 14 12/16/2011 0419   CREATININE 1.16 12/16/2011 0419   GLUCOSE 86 12/16/2011 0419   CALCIUM 8.9 12/16/2011 0419   AST 9 12/16/2011 0419   ALT 6 12/16/2011 0419   ALKPHOS 59 12/16/2011 0419   BILITOT 0.9 12/16/2011 0419   PROT 6.2 12/16/2011 0419   ALBUMIN 3.2* 12/16/2011 0419     Studies/Results: Ct Abdomen Pelvis W Contrast  12/15/2011  *RADIOLOGY REPORT*  Clinical Data: Left-sided abdominal pain, history of diverticulitis and colon surgery, evaluate for small bowel obstruction  CT ABDOMEN AND PELVIS WITH CONTRAST  Technique:  Multidetector CT imaging of the abdomen and pelvis was performed following the standard protocol during bolus administration of intravenous contrast.  Contrast: OMNIPAQUE IOHEXOL 300 MG/ML IV SOLN  Comparison: [facility] CT abdomen/pelvis dated 02/20/2009  Findings:  Dependent atelectasis at the lung bases.  Three hypoenhancing lesions in the posterior segment right hepatic lobe (series 2/images 24 and 31) and left hepatic dome (series 2/image 11), likely benign.  Spleen, pancreas, and adrenal glands are within normal limits.  Gallbladder is unremarkable.  No intrahepatic or extrahepatic ductal dilatation.  Kidneys within normal limits.  No hydronephrosis.  No evidence of bowel obstruction.  Prior bowel resection with suture lines in the right abdomen.  Colonic diverticulosis with surrounding inflammatory changes involving a loop of descending colon (series 2/image 30), compatible with diverticulitis.  No drainable fluid collection or abscess.  No free air.  Atherosclerotic calcifications of the abdominal aorta and branch vessels.  No abdominopelvic ascites.  No suspicious abdominopelvic lymphadenopathy.  Prostate is unremarkable.  Bladder is within normal limits.  Mild degenerative changes of the visualized thoracolumbar spine.  IMPRESSION: Descending colonic diverticulitis.  No drainable fluid collection or abscess.  No free air.  No evidence of bowel obstruction.  Prior bowel resection in the right abdomen.  Original Report Authenticated By: Charline Bills, M.D.   Dg Abd Acute W/chest  12/15/2011  *RADIOLOGY REPORT*  Clinical Data: Left lower quadrant pain  ACUTE ABDOMEN SERIES (ABDOMEN 2 VIEW & CHEST 1 VIEW)  Comparison: 02/20/2009  Findings: Heart size is normal.  No pleural effusion or pulmonary edema.  No airspace consolidation identified.  There are  dilated loops of small bowel identified.  These measure up to 4 cm.  Within the right lower quadrant of the abdomen there are several air-fluid levels identified.  IMPRESSION:  1.  No active cardiopulmonary abnormalities.  2.  Small bowel dilatation with air-fluid levels suspicious for bowel obstruction.  Original Report Authenticated By: Rosealee Albee, M.D.    Anti-infectives: Anti-infectives     Start      Dose/Rate Route Frequency Ordered Stop   12/16/11 0200  piperacillin-tazobactam (ZOSYN) IVPB 3.375 g       3.375 g 12.5 mL/hr over 240 Minutes Intravenous Every 8 hours 12/16/11 0134     12/15/11 1845   ciprofloxacin (CIPRO) IVPB 400 mg        400 mg 200 mL/hr over 60 Minutes Intravenous  Once 12/15/11 1832 12/15/11 2002   12/15/11 1845   metroNIDAZOLE (FLAGYL) IVPB 500 mg        500 mg 100 mL/hr over 60 Minutes Intravenous  Once 12/15/11 1832 12/15/11 2145          Assessment Principal Problem:  *Diverticulitis of descending colon-improving clinically on IV abxs. Active Problems:  GERD  Dehydration  Hyponatremia    LOS: 2 days   Plan: Clear liquids, continue IV abxs.   Teriana Danker J 12/17/2011

## 2011-12-18 LAB — BASIC METABOLIC PANEL
BUN: 13 mg/dL (ref 6–23)
CO2: 20 mEq/L (ref 19–32)
Calcium: 9.2 mg/dL (ref 8.4–10.5)
Chloride: 103 mEq/L (ref 96–112)
Creatinine, Ser: 1.16 mg/dL (ref 0.50–1.35)
Glucose, Bld: 88 mg/dL (ref 70–99)

## 2011-12-18 LAB — CBC
HCT: 35.7 % — ABNORMAL LOW (ref 39.0–52.0)
MCH: 32.4 pg (ref 26.0–34.0)
MCV: 92.5 fL (ref 78.0–100.0)
Platelets: 163 10*3/uL (ref 150–400)
RDW: 12.3 % (ref 11.5–15.5)
WBC: 6 10*3/uL (ref 4.0–10.5)

## 2011-12-18 NOTE — Progress Notes (Signed)
  Subjective: No further pain.  Tolerating clear liquid diet.  Bowels moving.  No hematochezia.  Objective: Vital signs in last 24 hours: Temp:  [98 F (36.7 C)-98.4 F (36.9 C)] 98.2 F (36.8 C) (01/20 0505) Pulse Rate:  [57-69] 59  (01/20 0505) Resp:  [16-18] 18  (01/20 0505) BP: (124-133)/(73-86) 129/81 mmHg (01/20 0505) SpO2:  [94 %-96 %] 94 % (01/20 0505) Last BM Date: 12/17/11  Intake/Output from previous day: 01/19 0701 - 01/20 0700 In: 1380 [P.O.:720; I.V.:600; IV Piggyback:60] Out: -  Intake/Output this shift:    PE: Abd-soft, nontender  Lab Results:   Basename 12/18/11 0426 12/17/11 0458  WBC 6.0 7.7  HGB 12.5* 12.5*  HCT 35.7* 35.0*  PLT 163 130*   BMET  Basename 12/18/11 0426 12/16/11 0419  NA 137 131*  K 3.7 3.7  CL 103 100  CO2 20 20  GLUCOSE 88 86  BUN 13 14  CREATININE 1.16 1.16  CALCIUM 9.2 8.9   PT/INR  Basename 12/16/11 0419  LABPROT 15.0  INR 1.16   Comprehensive Metabolic Panel:    Component Value Date/Time   NA 137 12/18/2011 0426   K 3.7 12/18/2011 0426   CL 103 12/18/2011 0426   CO2 20 12/18/2011 0426   BUN 13 12/18/2011 0426   CREATININE 1.16 12/18/2011 0426   GLUCOSE 88 12/18/2011 0426   CALCIUM 9.2 12/18/2011 0426   AST 9 12/16/2011 0419   ALT 6 12/16/2011 0419   ALKPHOS 59 12/16/2011 0419   BILITOT 0.9 12/16/2011 0419   PROT 6.2 12/16/2011 0419   ALBUMIN 3.2* 12/16/2011 0419     Studies/Results: No results found.  Anti-infectives: Anti-infectives     Start     Dose/Rate Route Frequency Ordered Stop   12/16/11 0200  piperacillin-tazobactam (ZOSYN) IVPB 3.375 g       3.375 g 12.5 mL/hr over 240 Minutes Intravenous Every 8 hours 12/16/11 0134     12/15/11 1845   ciprofloxacin (CIPRO) IVPB 400 mg        400 mg 200 mL/hr over 60 Minutes Intravenous  Once 12/15/11 1832 12/15/11 2002   12/15/11 1845   metroNIDAZOLE (FLAGYL) IVPB 500 mg        500 mg 100 mL/hr over 60 Minutes Intravenous  Once 12/15/11 1832 12/15/11 2145            Assessment Principal Problem:  *Diverticulitis-continued clinical improvement. Active Problems:  GERD  Dehydration  Hyponatremia    LOS: 3 days   Plan: Slowly advance diet.  Continue IV abxs.  Xochitl Egle J 12/18/2011

## 2011-12-18 NOTE — Progress Notes (Signed)
PATIENT DETAILS Name: Christopher Guerrero Age: 46 y.o. Sex: male Date of Birth: 1966-06-22 Admit Date: 12/15/2011 LKG:[id number] Christopher Barre, MD, MD Emergency contact:   CONSULTS: 1.  Dr. Abigail Miyamoto, Surgery  Interval History: [place] is a 46 year old gentleman with a history of gastroesophageal reflux disease, history of perforated diverticulitis status post 2 stage colonic resection 09/09/2006, history of enterocutaneous fistula and colo-cutaneous fistula with abdominal wall abscess status post expiratory laparotomy with resection of enterocutaneous fistula segmental small bowel resection segmental left colon resection with primary anastomosis resection of abdominal wall including skin subcutaneous tissue muscle and fascia with indicated appendectomy and complex abdominal wall closure. Patient also had an extensive hospitalization for this enterocutaneous fistula in 2008. He presented to the ED on 12/15/11 with a three-day history of sudden onset of left lower quadrant abdominal pain, nausea, emesis, fever with a temperature of 100, chills, generalized weakness and no BM x 2 days.   ROS: Christopher Guerrero feels well.  No current abdominal pain.  Passing flatus.  No N/V over night and is tolerating his diet.  Had a  BM again today.   Objective: Vital signs in last 24 hours: Temp:  [98.2 F (36.8 C)-98.4 F (36.9 C)] 98.2 F (36.8 C) (01/20 0505) Pulse Rate:  [57-59] 59  (01/20 0505) Resp:  [18] 18  (01/20 0505) BP: (124-129)/(73-81) 129/81 mmHg (01/20 0505) SpO2:  [94 %-96 %] 94 % (01/20 0505) Weight change:  Last BM Date: 12/17/11  Intake/Output from previous day:  Intake/Output Summary (Last 24 hours) at 12/18/11 1552 Last data filed at 12/18/11 1300  Gross per 24 hour  Intake   1380 ml  Output      0 ml  Net   1380 ml     Physical Exam:  Gen:  NAD Cardiovascular:  RRR, No M/R/G Respiratory: Lungs CTAB Gastrointestinal: Abdomen soft, ND/NT with normal active bowel sounds.     Extremities: No C/E/C    Lab Results: Basic Metabolic Panel:  Lab 12/18/11 2536 12/16/11 0419 12/15/11 1539 12/15/11 1202  NA 137 131* 135 135  K 3.7 3.7 -- --  CL 103 100 99 100  CO2 20 20 24 27   GLUCOSE 88 86 93 91  BUN 13 14 15 16   CREATININE 1.16 1.16 1.20 1.4  CALCIUM 9.2 8.9 10.1 9.5  MG -- 1.7 -- --  PHOS -- -- -- --   GFR Estimated Creatinine Clearance: 84.7 ml/min (by C-G formula based on Cr of 1.16). Liver Function Tests:  Lab 12/16/11 0419 12/15/11 1539 12/15/11 1202  AST 9 13 16   ALT 6 8 12   ALKPHOS 59 87 61  BILITOT 0.9 0.9 1.1  PROT 6.2 7.5 7.3  ALBUMIN 3.2* 4.0 4.4    Lab 12/15/11 1539  LIPASE 17  AMYLASE --   Coagulation profile  Lab 12/16/11 0419  INR 1.16  PROTIME --    CBC:  Lab 12/18/11 0426 12/17/11 0458 12/16/11 0419 12/15/11 1539 12/15/11 1202  WBC 6.0 7.7 11.9* 14.9* 16.5*  NEUTROABS -- -- 9.2* 11.6* 13.2*  HGB 12.5* 12.5* 13.4 15.3 16.1  HCT 35.7* 35.0* 37.6* 43.8 47.1  MCV 92.5 93.1 93.8 93.8 98.5  PLT 163 130* 139* 172 189.0    Studies/Results: No results found.  Medications: Scheduled Meds:    . enoxaparin  40 mg Subcutaneous Q24H  . pantoprazole (PROTONIX) IV  40 mg Intravenous QHS  . piperacillin-tazobactam (ZOSYN)  IV  3.375 g Intravenous Q8H   Continuous Infusions:    .  sodium chloride 50 mL/hr at 12/17/11 1547   PRN Meds:.acetaminophen, acetaminophen, alum & mag hydroxide-simeth, morphine, ondansetron (ZOFRAN) IV, ondansetron, oxyCODONE Antibiotics: Anti-infectives     Start     Dose/Rate Route Frequency Ordered Stop   12/16/11 0200   piperacillin-tazobactam (ZOSYN) IVPB 3.375 g        3.375 g 12.5 mL/hr over 240 Minutes Intravenous Every 8 hours 12/16/11 0134     12/15/11 1845   ciprofloxacin (CIPRO) IVPB 400 mg        400 mg 200 mL/hr over 60 Minutes Intravenous  Once 12/15/11 1832 12/15/11 2002   12/15/11 1845   metroNIDAZOLE (FLAGYL) IVPB 500 mg        500 mg 100 mL/hr over 60 Minutes  Intravenous  Once 12/15/11 1832 12/15/11 2145           Assessment/Plan:  Principal Problem:  *Diverticulitis The patient was admitted and put on empiric Zosyn.  Surgical consultation was requested and kindly provided by Dr. Magnus Ivan who felt that the patient had an uncomplicated diverticulitis and did not require surgical intervention.  CT abdomen and pelvis was negative for obstruction/abscess/perforation.  He is improving with current therapy.  His diet has been advanced to [facility].  Continue empiric antibiotics. Active Problems:  GERD The patient has been put on IV PPI therapy.  Dehydration The patient is receiving vigorous IVF rehydration.  We decreased his  IVF rate 12/17/11, since he has been taking good POs.  Hyponatremia The patient is being hydrated with isotonic IVF.  Resolved.    LOS: 3 days   Christopher Aldo, MD Pager [phone]  12/18/2011, 3:52 PM

## 2011-12-19 MED ORDER — CIPROFLOXACIN HCL 500 MG PO TABS: 500.0000 mg | ORAL_TABLET | Freq: Two times a day (BID) | ORAL | Status: AC

## 2011-12-19 MED ORDER — PANTOPRAZOLE SODIUM 40 MG PO TBEC: 40.0000 mg | DELAYED_RELEASE_TABLET | Freq: Every day | ORAL | Status: DC

## 2011-12-19 MED ORDER — METRONIDAZOLE 500 MG PO TABS: 500.0000 mg | ORAL_TABLET | Freq: Three times a day (TID) | ORAL | Status: AC

## 2011-12-19 NOTE — Progress Notes (Signed)
Patient ID: Christopher ARCIA, male   DOB: 06-Feb-1966, 46 y.o.   MRN: [id number]    Subjective: Pt wants to go home.  Tolerating full liquids.  No further pain.  +flatus and BMs  Objective: Vital signs in last 24 hours: Temp:  [97.7 F (36.5 C)-98.5 F (36.9 C)] 98 F (36.7 C) (01/21 0530) Pulse Rate:  [52-60] 52  (01/21 0530) Resp:  [16] 16  (01/21 0530) BP: (114-143)/(74-84) 114/75 mmHg (01/21 0530) SpO2:  [96 %-97 %] 96 % (01/21 0530) Last BM Date: 12/18/11  Intake/Output from previous day: 01/20 0701 - 01/21 0700 In: 1681.7 [P.O.:720; I.V.:801.7; IV Piggyback:160] Out: -  Intake/Output this shift:    PE: Abd: soft, NT, ND, +BS Heart: regular Lungs: CTAB  Lab Results:   Basename 12/18/11 0426 12/17/11 0458  WBC 6.0 7.7  HGB 12.5* 12.5*  HCT 35.7* 35.0*  PLT 163 130*   BMET  Basename 12/18/11 0426  NA 137  K 3.7  CL 103  CO2 20  GLUCOSE 88  BUN 13  CREATININE 1.16  CALCIUM 9.2   PT/INR No results found for this basename: LABPROT:2,INR:2 in the last 72 hours   Studies/Results: No results found.  Anti-infectives: Anti-infectives     Start     Dose/Rate Route Frequency Ordered Stop   12/16/11 0200  piperacillin-tazobactam (ZOSYN) IVPB 3.375 g       3.375 g 12.5 mL/hr over 240 Minutes Intravenous Every 8 hours 12/16/11 0134     12/15/11 1845   ciprofloxacin (CIPRO) IVPB 400 mg        400 mg 200 mL/hr over 60 Minutes Intravenous  Once 12/15/11 1832 12/15/11 2002   12/15/11 1845   metroNIDAZOLE (FLAGYL) IVPB 500 mg        500 mg 100 mL/hr over 60 Minutes Intravenous  Once 12/15/11 1832 12/15/11 2145           Assessment/Plan  1. Diverticulitis, uncomplicated 2. S/p multiple surgeries for diverticulitis in past  Plan: 1. Will advance to low fiber diet.  Recommend change to po abx for another 7-10 days as outpatient.  If pt tolerates low fiber diet today, do not see any reason why he couldn't go home later this afternoon or in the morning, per  primary service. 2. Follow up with Dr.  Derrell Lolling as needed.   LOS: 4 days    Lamari Youngers E 12/19/2011

## 2011-12-19 NOTE — Progress Notes (Signed)
Looks great.  OK for dc.

## 2011-12-19 NOTE — Progress Notes (Signed)
Protonix changed from IV to PO per [employer] policy.   Tallyn Holroyd, Loma Messing PharmD 8:40 AM 12/19/2011

## 2011-12-19 NOTE — Discharge Summary (Signed)
Physician Discharge Summary  Patient ID: Christopher Guerrero MRN: [id number] DOB/AGE: Jan 13, 1966 46 y.o.  Admit date: 12/15/2011 Discharge date: 12/19/2011  Primary Care Physician:  Sanda Linger, MD, MD   Discharge Diagnoses:    Present on Admission:  .Diverticulitis .Dehydration .GERD .Hyponatremia  Discharge Medications:  Current Discharge Medication List    START taking these medications   Details  ciprofloxacin (CIPRO) 500 MG tablet Take 1 tablet (500 mg total) by mouth 2 (two) times daily. Qty: 18 tablet, Refills: 0    metroNIDAZOLE (FLAGYL) 500 MG tablet Take 1 tablet (500 mg total) by mouth 3 (three) times daily. Qty: 27 tablet, Refills: 0         Disposition and Follow-up: The patient is being discharged home.  He is instructed to F/U with his PCP in 1 week.  Consults:  1. Dr. Abigail Miyamoto, Surgery   Significant Diagnostic Studies:  Ct Abdomen Pelvis W Contrast  12/15/2011  *RADIOLOGY REPORT*  Clinical Data: Left-sided abdominal pain, history of diverticulitis and colon surgery, evaluate for small bowel obstruction  CT ABDOMEN AND PELVIS WITH CONTRAST  Technique:  Multidetector CT imaging of the abdomen and pelvis was performed following the standard protocol during bolus administration of intravenous contrast.  Contrast: OMNIPAQUE IOHEXOL 300 MG/ML IV SOLN  Comparison: [facility] CT abdomen/pelvis dated 02/20/2009  Findings: Dependent atelectasis at the lung bases.  Three hypoenhancing lesions in the posterior segment right hepatic lobe (series 2/images 24 and 31) and left hepatic dome (series 2/image 11), likely benign.  Spleen, pancreas, and adrenal glands are within normal limits.  Gallbladder is unremarkable.  No intrahepatic or extrahepatic ductal dilatation.  Kidneys within normal limits.  No hydronephrosis.  No evidence of bowel obstruction.  Prior bowel resection with suture lines in the right abdomen.  Colonic diverticulosis with surrounding  inflammatory changes involving a loop of descending colon (series 2/image 30), compatible with diverticulitis.  No drainable fluid collection or abscess.  No free air.  Atherosclerotic calcifications of the abdominal aorta and branch vessels.  No abdominopelvic ascites.  No suspicious abdominopelvic lymphadenopathy.  Prostate is unremarkable.  Bladder is within normal limits.  Mild degenerative changes of the visualized thoracolumbar spine.  IMPRESSION: Descending colonic diverticulitis.  No drainable fluid collection or abscess.  No free air.  No evidence of bowel obstruction.  Prior bowel resection in the right abdomen.  Original Report Authenticated By: Charline Bills, M.D.   Dg Abd Acute W/chest  12/15/2011  *RADIOLOGY REPORT*  Clinical Data: Left lower quadrant pain  ACUTE ABDOMEN SERIES (ABDOMEN 2 VIEW & CHEST 1 VIEW)  Comparison: 02/20/2009  Findings: Heart size is normal.  No pleural effusion or pulmonary edema.  No airspace consolidation identified.  There are dilated loops of small bowel identified.  These measure up to 4 cm.  Within the right lower quadrant of the abdomen there are several air-fluid levels identified.  IMPRESSION:  1.  No active cardiopulmonary abnormalities.  2.  Small bowel dilatation with air-fluid levels suspicious for bowel obstruction.  Original Report Authenticated By: Rosealee Albee, M.D.    Discharge Laboratory Values: Basic Metabolic Panel:  Lab 12/18/11 4401 12/16/11 0419 12/15/11 1539 12/15/11 1202  NA 137 131* 135 135  K 3.7 3.7 -- --  CL 103 100 99 100  CO2 20 20 24 27   GLUCOSE 88 86 93 91  BUN 13 14 15 16   CREATININE 1.16 1.16 1.20 1.4  CALCIUM 9.2 8.9 10.1 9.5  MG -- 1.7 -- --  PHOS -- -- -- --   GFR Estimated Creatinine Clearance: 84.7 ml/min (by C-G formula based on Cr of 1.16). Liver Function Tests:  Lab 12/16/11 0419 12/15/11 1539 12/15/11 1202  AST 9 13 16   ALT 6 8 12   ALKPHOS 59 87 61  BILITOT 0.9 0.9 1.1  PROT 6.2 7.5 7.3  ALBUMIN  3.2* 4.0 4.4    Lab 12/15/11 1539  LIPASE 17  AMYLASE --   Coagulation profile  Lab 12/16/11 0419  INR 1.16  PROTIME --    CBC:  Lab 12/18/11 0426 12/17/11 0458 12/16/11 0419 12/15/11 1539 12/15/11 1202  WBC 6.0 7.7 11.9* 14.9* 16.5*  NEUTROABS -- -- 9.2* 11.6* 13.2*  HGB 12.5* 12.5* 13.4 15.3 16.1  HCT 35.7* 35.0* 37.6* 43.8 47.1  MCV 92.5 93.1 93.8 93.8 98.5  PLT 163 130* 139* 172 189.0   Microbiology Recent Results (from the past 240 hour(s))  URINE CULTURE     Status: Normal   Collection Time   12/16/11  6:45 AM      Component Value Range Status Comment   Specimen Description URINE, CLEAN CATCH   Final    Special Requests NONE   Final    Setup Time [id number]   Final    Colony Count NO GROWTH   Final    Culture NO GROWTH   Final    Report Status 12/17/2011 FINAL   Final   MRSA PCR SCREENING     Status: Abnormal   Collection Time   12/17/11 11:00 AM      Component Value Range Status Comment   MRSA by PCR INVALID RESULTS, SPECIMEN SENT FOR CULTURE (*) NEGATIVE  Final   MRSA CULTURE     Status: Normal   Collection Time   12/17/11 11:00 AM      Component Value Range Status Comment   Specimen Description NOSE   Final    Special Requests NONE   Final    Culture     Final    Value: NO STAPHYLOCOCCUS AUREUS ISOLATED     Note: NO MRSA ISOLATED   Report Status 12/19/2011 FINAL   Final      Brief H and P: For complete details please refer to admission H and P, but in brief, Christopher Guerrero is a 46 year old gentleman with a history of gastroesophageal reflux disease, history of perforated diverticulitis status post 2 stage colonic resection 09/09/2006, history of enterocutaneous fistula and colo-cutaneous fistula with abdominal wall abscess status post expiratory laparotomy with resection of enterocutaneous fistula segmental small bowel resection segmental left colon resection with primary anastomosis resection of abdominal wall including skin subcutaneous tissue muscle and  fascia with indicated appendectomy and complex abdominal wall closure. Patient also had an extensive hospitalization for this enterocutaneous fistula in 2008. He presented to the ED on 12/15/11 with a three-day history of sudden onset of left lower quadrant abdominal pain, nausea, emesis, fever with a temperature of 100, chills, generalized weakness and no BM x 2 days.    Physical Exam at Discharge: BP 125/85  Pulse 65  Temp(Src) 97.9 F (36.6 C) (Oral)  Resp 18  Ht 5\' 11"  (1.803 m)  Wt 85 kg (187 lb 6.3 oz)  BMI 26.14 kg/m2  SpO2 95% Gen:  NAD Cardiovascular:  RRR, No M/R/G Respiratory: Lungs CTAB Gastrointestinal: Abdomen soft, NT/ND with normal active bowel sounds. Extremities: No C/E/C      Hospital Course:  Principal Problem:  *Diverticulitis  The patient was admitted and put on  empiric Zosyn. Surgical consultation was requested and kindly provided by Dr. Magnus Ivan who felt that the patient had an uncomplicated diverticulitis and did not require surgical intervention. CT abdomen and pelvis was negative forobstruction/ abscess/perforation. His diet was gradually advanced, and at the time of discharge, he was tolerating a regular diet with no complaints of N/V or pain.  He will be discharged on a 9 day course of Cipro/Flagyl for a total treatment course of 14 days. Active Problems:  GERD  The patient was put on IV PPI therapy. No further PPI needed at discharge. Dehydration  Resolved with IVF.  Hyponatremia  The patient was hydrated with isotonic IVF. Resolved.   Diet:  Low sodium, heart healthy  Activity:  Increase activity slowly  Condition at Discharge:   Improved  Time spent on Discharge:  35 minutes.  Signed: Dr. Trula Ore Rama Pager [phone] 12/19/2011, 2:45 PM

## 2011-12-27 ENCOUNTER — Ambulatory Visit: Payer: BC Managed Care – PPO | Admitting: Internal Medicine

## 2012-01-02 ENCOUNTER — Ambulatory Visit: Payer: BC Managed Care – PPO | Admitting: Internal Medicine

## 2012-01-04 ENCOUNTER — Ambulatory Visit (INDEPENDENT_AMBULATORY_CARE_PROVIDER_SITE_OTHER): Payer: BC Managed Care – PPO | Admitting: Internal Medicine

## 2012-01-04 ENCOUNTER — Encounter: Payer: Self-pay | Admitting: Internal Medicine

## 2012-01-04 DIAGNOSIS — K5732 Diverticulitis of large intestine without perforation or abscess without bleeding: Secondary | ICD-10-CM

## 2012-01-04 DIAGNOSIS — K5792 Diverticulitis of intestine, part unspecified, without perforation or abscess without bleeding: Secondary | ICD-10-CM

## 2012-01-04 NOTE — Progress Notes (Signed)
  Subjective:    Patient ID: Christopher Guerrero, male    DOB: 1965-12-16, 46 y.o.   MRN: [id number]  HPI He returns for f/up after a recent admission to the hospital for a severe episode of diverticulitis that responded well to antibiotics and did not require surgery.   Review of Systems  Constitutional: Negative for fever, chills, diaphoresis, activity change, appetite change, fatigue and unexpected weight change.  HENT: Negative.   Eyes: Negative.   Respiratory: Negative for apnea, cough, choking, chest tightness, shortness of breath, wheezing and stridor.   Cardiovascular: Negative for chest pain, palpitations and leg swelling.  Gastrointestinal: Negative for nausea, vomiting, abdominal pain, diarrhea, constipation, blood in stool, abdominal distention, anal bleeding and rectal pain.  Genitourinary: Negative.   Musculoskeletal: Negative.   Skin: Negative for color change, pallor, rash and wound.  Neurological: Negative for dizziness, facial asymmetry, light-headedness and headaches.  Hematological: Negative for adenopathy. Does not bruise/bleed easily.  Psychiatric/Behavioral: Negative.        Objective:   Physical Exam  Vitals reviewed. Constitutional: He is oriented to person, place, and time. He appears well-developed and well-nourished. No distress.  HENT:  Head: Normocephalic and atraumatic.  Mouth/Throat: Oropharynx is clear and moist. No oropharyngeal exudate.  Eyes: Conjunctivae are normal. Right eye exhibits no discharge. Left eye exhibits no discharge. No scleral icterus.  Neck: Normal range of motion. Neck supple. No JVD present. No tracheal deviation present. No thyromegaly present.  Cardiovascular: Normal rate, regular rhythm, normal heart sounds and intact distal pulses.  Exam reveals no gallop and no friction rub.   No murmur heard. Pulmonary/Chest: Effort normal and breath sounds normal. No stridor. No respiratory distress. He has no wheezes. He has no rales. He  exhibits no tenderness.  Abdominal: Soft. Bowel sounds are normal. He exhibits no distension and no mass. There is no tenderness. There is no rebound and no guarding.  Musculoskeletal: Normal range of motion. He exhibits no edema and no tenderness.  Lymphadenopathy:    He has no cervical adenopathy.  Neurological: He is oriented to person, place, and time.  Skin: Skin is warm and dry. No rash noted. He is not diaphoretic. No erythema. No pallor.  Psychiatric: He has a normal mood and affect. His behavior is normal. Judgment and thought content normal.      Lab Results  Component Value Date   WBC 6.0 12/18/2011   HGB 12.5* 12/18/2011   HCT 35.7* 12/18/2011   PLT 163 12/18/2011   GLUCOSE 88 12/18/2011   CHOL 162 04/01/2010   TRIG 187.0* 04/01/2010   HDL 29.40* 04/01/2010   LDLCALC 95 04/01/2010   ALT 6 12/16/2011   AST 9 12/16/2011   NA 137 12/18/2011   K 3.7 12/18/2011   CL 103 12/18/2011   CREATININE 1.16 12/18/2011   BUN 13 12/18/2011   CO2 20 12/18/2011   TSH 1.37 04/01/2010   INR 1.16 12/16/2011      Assessment & Plan:

## 2012-01-04 NOTE — Patient Instructions (Signed)
High Fiber Diet A high fiber diet changes your normal diet to include more whole grains, legumes, fruits, and vegetables. Changes in the diet involve replacing refined carbohydrates with unrefined foods. The calorie level of the diet is essentially unchanged. The Dietary Reference Intake (recommended amount) for adult males is 38 g per day. For adult females, it is 25 g per day. Pregnant and lactating women should consume 28 g of fiber per day. Fiber is the intact part of a plant that is not broken down during digestion. Functional fiber is fiber that has been isolated from the plant to provide a beneficial effect in the body. PURPOSE Increase stool bulk.  Ease and regulate bowel movements.  Lower cholesterol.  INDICATIONS THAT YOU NEED MORE FIBER Constipation and hemorrhoids.  Uncomplicated diverticulosis (intestine condition) and irritable bowel syndrome.  Weight management.  As a protective measure against hardening of the arteries (atherosclerosis), diabetes, and cancer.  NOTE OF CAUTION If you have a digestive or bowel problem, ask your caregiver for advice before adding high fiber foods to your diet. Some of the following medical problems are such that a high fiber diet should not be used without consulting your caregiver: Acute diverticulitis (intestine infection).  Partial small bowel obstructions.  Complicated diverticular disease involving bleeding, rupture (perforation), or abscess (boil, furuncle).  Presence of autonomic neuropathy (nerve damage) or gastric paresis (stomach cannot empty itself).  GUIDELINES FOR INCREASING FIBER Start adding fiber to the diet slowly. A gradual increase of about 5 more grams (2 slices of whole-wheat bread, 2 servings of most fruits or vegetables, or 1 bowl of high fiber cereal) per day is best. Too rapid an increase in fiber may result in constipation, flatulence, and bloating.  Drink enough water and fluids to keep your urine clear or pale yellow.  Water, juice, or caffeine-free drinks are recommended. Not drinking enough fluid may cause constipation.  Eat a variety of high fiber foods rather than one type of fiber.  Try to increase your intake of fiber through using high fiber foods rather than fiber pills or supplements that contain small amounts of fiber.  The goal is to change the types of food eaten. Do not supplement your present diet with high fiber foods, but replace foods in your present diet.  INCLUDE A VARIETY OF FIBER SOURCES Replace refined and processed grains with whole grains, canned fruits with fresh fruits, and incorporate other fiber sources. White rice, white breads, and most bakery goods contain little or no fiber.  Brown whole-grain rice, buckwheat oats, and many fruits and vegetables are all good sources of fiber. These include: broccoli, Brussels sprouts, cabbage, cauliflower, beets, sweet potatoes, white potatoes (skin on), carrots, tomatoes, eggplant, squash, berries, fresh fruits, and dried fruits.  Cereals appear to be the richest source of fiber. Cereal fiber is found in whole grains and bran. Bran is the fiber-rich outer coat of cereal grain, which is largely removed in refining. In whole-grain cereals, the bran remains. In breakfast cereals, the largest amount of fiber is found in those with "bran" in their names. The fiber content is sometimes indicated on the label.  You may need to include additional fruits and vegetables each day.  In baking, for 1 cup white flour, you may use the following substitutions:  1 cup whole-wheat flour minus 2 tbs.   cup white flour plus  cup whole-wheat flour.  Document Released: 11/14/2005 Document Revised: 07/27/2011 Document Reviewed: 09/22/2009 [facility] Patient Information 2012 [place], [place].Diverticulitis A diverticulum is a  small pouch or sac on the colon. Diverticulosis is the presence of these diverticula on the colon. Diverticulitis is the irritation (inflammation) or  infection of diverticula. CAUSES  The colon and its diverticula contain bacteria. If food particles block the tiny opening to a diverticulum, the bacteria inside can grow and cause an increase in pressure. This leads to infection and inflammation and is called diverticulitis. SYMPTOMS   Abdominal pain and tenderness. Usually, the pain is located on the left side of your abdomen. However, it could be located elsewhere.   Fever.   Bloating.   Feeling sick to your stomach (nausea).   Throwing up (vomiting).   Abnormal stools.  DIAGNOSIS  Your caregiver will take a history and perform a physical exam. Since many things can cause abdominal pain, other tests may be necessary. Tests may include:  Blood tests.   Urine tests.   X-ray of the abdomen.   CT scan of the abdomen.  Sometimes, surgery is needed to determine if diverticulitis or other conditions are causing your symptoms. TREATMENT  Most of the time, you can be treated without surgery. Treatment includes:  Resting the bowels by only having liquids for a few days. As you improve, you will need to eat a low-fiber diet.   Intravenous (IV) fluids if you are losing body fluids (dehydrated).   Antibiotic medicines that treat infections may be given.   Pain and nausea medicine, if needed.   Surgery if the inflamed diverticulum has burst.  HOME CARE INSTRUCTIONS   Try a clear liquid diet (broth, tea, or water for as long as directed by your caregiver). You may then gradually begin a low-fiber diet as tolerated. A low-fiber diet is a diet with less than 10 grams of fiber. Choose the foods below to reduce fiber in the diet:   White breads, cereals, rice, and pasta.   Cooked fruits and vegetables or soft fresh fruits and vegetables without the skin.   Ground or well-cooked tender beef, ham, veal, lamb, pork, or poultry.   Eggs and seafood.   After your diverticulitis symptoms have improved, your caregiver may put you on a  high-fiber diet. A high-fiber diet includes 14 grams of fiber for every 1000 calories consumed. For a standard 2000 calorie diet, you would need 28 grams of fiber. Follow these diet guidelines to help you increase the fiber in your diet. It is important to slowly increase the amount fiber in your diet to avoid gas, constipation, and bloating.   Choose whole-grain breads, cereals, pasta, and brown rice.   Choose fresh fruits and vegetables with the skin on. Do not overcook vegetables because the more vegetables are cooked, the more fiber is lost.   Choose more nuts, seeds, legumes, dried peas, beans, and lentils.   Look for food products that have greater than 3 grams of fiber per serving on the Nutrition Facts label.   Take all medicine as directed by your caregiver.   If your caregiver has given you a follow-up appointment, it is very important that you go. Not going could result in lasting (chronic) or permanent injury, pain, and disability. If there is any problem keeping the appointment, call to reschedule.  SEEK MEDICAL CARE IF:   Your pain does not improve.   You have a hard time advancing your diet beyond clear liquids.   Your bowel movements do not return to normal.  SEEK IMMEDIATE MEDICAL CARE IF:   Your pain becomes worse.   You have  an oral temperature above 102 F (38.9 C), not controlled by medicine.   You have repeated vomiting.   You have bloody or black, tarry stools.   Symptoms that brought you to your caregiver become worse or are not getting better.  MAKE SURE YOU:   Understand these instructions.   Will watch your condition.   Will get help right away if you are not doing well or get worse.  Document Released: 08/24/2005 Document Revised: 07/27/2011 Document Reviewed: 12/20/2010 [facility] Patient Information 2012 [place], [place].

## 2012-01-04 NOTE — Assessment & Plan Note (Signed)
resolved 

## 2013-11-27 ENCOUNTER — Ambulatory Visit (INDEPENDENT_AMBULATORY_CARE_PROVIDER_SITE_OTHER)
Admission: RE | Admit: 2013-11-27 | Discharge: 2013-11-27 | Disposition: A | Discharge status: Home / Self Care | Payer: BC Managed Care – PPO | Source: Ambulatory Visit | Attending: Internal Medicine | Admitting: Internal Medicine

## 2013-11-27 ENCOUNTER — Encounter: Payer: Self-pay | Admitting: Internal Medicine

## 2013-11-27 ENCOUNTER — Ambulatory Visit (INDEPENDENT_AMBULATORY_CARE_PROVIDER_SITE_OTHER): Payer: BC Managed Care – PPO | Admitting: Internal Medicine

## 2013-11-27 ENCOUNTER — Telehealth: Payer: Self-pay

## 2013-11-27 ENCOUNTER — Other Ambulatory Visit (INDEPENDENT_AMBULATORY_CARE_PROVIDER_SITE_OTHER): Payer: BC Managed Care – PPO

## 2013-11-27 VITALS — BP 110/78 | HR 73 | Temp 98.5°F | Resp 16 | Ht 71.0 in | Wt 200.8 lb

## 2013-11-27 DIAGNOSIS — R590 Localized enlarged lymph nodes: Secondary | ICD-10-CM

## 2013-11-27 DIAGNOSIS — R059 Cough, unspecified: Secondary | ICD-10-CM | POA: Insufficient documentation

## 2013-11-27 DIAGNOSIS — R599 Enlarged lymph nodes, unspecified: Secondary | ICD-10-CM

## 2013-11-27 DIAGNOSIS — J309 Allergic rhinitis, unspecified: Secondary | ICD-10-CM | POA: Insufficient documentation

## 2013-11-27 DIAGNOSIS — R05 Cough: Secondary | ICD-10-CM

## 2013-11-27 LAB — COMPREHENSIVE METABOLIC PANEL
ALT: 15 U/L (ref 0–53)
AST: 17 U/L (ref 0–37)
Albumin: 4.5 g/dL (ref 3.5–5.2)
Alkaline Phosphatase: 60 U/L (ref 39–117)
CO2: 28 mEq/L (ref 19–32)
Glucose, Bld: 83 mg/dL (ref 70–99)
Potassium: 4.5 mEq/L (ref 3.5–5.1)
Sodium: 139 mEq/L (ref 135–145)
Total Protein: 7.1 g/dL (ref 6.0–8.3)

## 2013-11-27 LAB — CBC WITH DIFFERENTIAL/PLATELET
Basophils Absolute: 0 10*3/uL (ref 0.0–0.1)
Eosinophils Absolute: 0.1 10*3/uL (ref 0.0–0.7)
HCT: 47.8 % (ref 39.0–52.0)
Lymphs Abs: 2.1 10*3/uL (ref 0.7–4.0)
MCHC: 34.5 g/dL (ref 30.0–36.0)
MCV: 96.2 fl (ref 78.0–100.0)
Monocytes Absolute: 0.6 10*3/uL (ref 0.1–1.0)
Monocytes Relative: 7.8 % (ref 3.0–12.0)
Neutro Abs: 5.3 10*3/uL (ref 1.4–7.7)
Platelets: 177 10*3/uL (ref 150.0–400.0)
RDW: 13.3 % (ref 11.5–14.6)

## 2013-11-27 LAB — SEDIMENTATION RATE: Sed Rate: 4 mm/hr (ref 0–22)

## 2013-11-27 MED ORDER — METHYLPREDNISOLONE ACETATE 80 MG/ML IJ SUSP
120.0000 mg | Freq: Once | INTRAMUSCULAR | Status: AC
  Administered 2013-11-27: 120 mg via INTRAMUSCULAR

## 2013-11-27 MED ORDER — AZELASTINE-FLUTICASONE 137-50 MCG/ACT NA SUSP: 1.0000 | Freq: Two times a day (BID) | NASAL | Status: DC

## 2013-11-27 NOTE — Assessment & Plan Note (Signed)
He is having a significant amount of symptoms and now some eustachian tube dysfunction so I gave him an injection of depo-medrol IM and have asked him to start dymista ns

## 2013-11-27 NOTE — Progress Notes (Signed)
Pre visit review using our clinic review tool, if applicable. No additional management support is needed unless otherwise documented below in the visit note. 

## 2013-11-27 NOTE — Assessment & Plan Note (Signed)
His CXR is negative for pna, mass, edema 

## 2013-11-27 NOTE — Telephone Encounter (Signed)
Patient wife called [facility] requesting a call back today. They have been made aware of xray results per MD but would like to know why CT has been ordered. I advised that office closes at 2pm today, MD currently seeing patients but I would forward message for more details.

## 2013-11-27 NOTE — Assessment & Plan Note (Signed)
Labs are normal but he does have risk factors for head and neck cancer so I have ordered a CT to see if this node is pathological

## 2013-11-27 NOTE — Telephone Encounter (Signed)
The CT scan was ordered to look at the lymph node - they have been informed

## 2013-11-27 NOTE — Patient Instructions (Signed)
Lymphadenopathy °Lymphadenopathy means "disease of the lymph glands." But the term is usually used to describe swollen or enlarged lymph glands, also called lymph nodes. These are the bean-shaped organs found in many locations including the neck, underarm, and groin. Lymph glands are part of the immune system, which fights infections in your body. Lymphadenopathy can occur in just one area of the body, such as the neck, or it can be generalized, with lymph node enlargement in several areas. The nodes found in the neck are the most common sites of lymphadenopathy. °CAUSES  °When your immune system responds to germs (such as viruses or bacteria ), infection-fighting cells and fluid build up. This causes the glands to grow in size. This is usually not something to worry about. Sometimes, the glands themselves can become infected and inflamed. This is called lymphadenitis. °Enlarged lymph nodes can be caused by many diseases: °· Bacterial disease, such as strep throat or a skin infection. °· Viral disease, such as a common cold. °· Other germs, such as lyme disease, tuberculosis, or sexually transmitted diseases. °· Cancers, such as lymphoma (cancer of the lymphatic system) or leukemia (cancer of the white blood cells). °· Inflammatory diseases such as lupus or rheumatoid arthritis. °· Reactions to medications. °Many of the diseases above are rare, but important. This is why you should see your caregiver if you have lymphadenopathy. °SYMPTOMS  °· Swollen, enlarged lumps in the neck, back of the head or other locations. °· Tenderness. °· Warmth or redness of the skin over the lymph nodes. °· Fever. °DIAGNOSIS  °Enlarged lymph nodes are often near the source of infection. They can help healthcare providers diagnose your illness. For instance:  °· Swollen lymph nodes around the jaw might be caused by an infection in the mouth. °· Enlarged glands in the neck often signal a throat infection. °· Lymph nodes that are swollen  in more than one area often indicate an illness caused by a virus. °Your caregiver most likely will know what is causing your lymphadenopathy after listening to your history and examining you. Blood tests, x-rays or other tests may be needed. If the cause of the enlarged lymph node cannot be found, and it does not go away by itself, then a biopsy may be needed. Your caregiver will discuss this with you. °TREATMENT  °Treatment for your enlarged lymph nodes will depend on the cause. Many times the nodes will shrink to normal size by themselves, with no treatment. Antibiotics or other medicines may be needed for infection. Only take over-the-counter or prescription medicines for pain, discomfort or fever as directed by your caregiver. °HOME CARE INSTRUCTIONS  °Swollen lymph glands usually return to normal when the underlying medical condition goes away. If they persist, contact your health-care provider. He/she might prescribe antibiotics or other treatments, depending on the diagnosis. Take any medications exactly as prescribed. Keep any follow-up appointments made to check on the condition of your enlarged nodes.  °SEEK MEDICAL CARE IF:  °· Swelling lasts for more than two weeks. °· You have symptoms such as weight loss, night sweats, fatigue or fever that does not go away. °· The lymph nodes are hard, seem fixed to the skin or are growing rapidly. °· Skin over the lymph nodes is red and inflamed. This could mean there is an infection. °SEEK IMMEDIATE MEDICAL CARE IF:  °· Fluid starts leaking from the area of the enlarged lymph node. °· You develop a fever of 102° F (38.9° C) or greater. °· Severe   pain develops (not necessarily at the site of a large lymph node). °· You develop chest pain or shortness of breath. °· You develop worsening abdominal pain. °MAKE SURE YOU:  °· Understand these instructions. °· Will watch your condition. °· Will get help right away if you are not doing well or get worse. °Document  Released: 08/23/2008 Document Revised: 02/06/2012 Document Reviewed: 08/23/2008 °[employer]® Patient Information ©2014 [employer]. ° °

## 2013-11-27 NOTE — Progress Notes (Signed)
Subjective:    Patient ID: Christopher Guerrero, male    DOB: 1966/01/18, 47 y.o.   MRN: [id number]  Cough This is a new problem. The current episode started 1 to 4 weeks ago. The problem has been gradually improving. The problem occurs every few hours. The cough is non-productive. Associated symptoms include nasal congestion and postnasal drip. Pertinent negatives include no chest pain, chills, ear congestion, ear pain, fever, headaches, heartburn, hemoptysis, myalgias, rash, rhinorrhea, sore throat, shortness of breath, sweats, weight loss or wheezing. Risk factors for lung disease include smoking/tobacco exposure. He has tried nothing for the symptoms. The treatment provided moderate relief. There is no history of bronchiectasis, bronchitis, COPD, emphysema, environmental allergies or pneumonia.      Review of Systems  Constitutional: Negative.  Negative for fever, chills, weight loss, diaphoresis, activity change, appetite change, fatigue and unexpected weight change.  HENT: Positive for postnasal drip. Negative for dental problem, drooling, ear discharge, ear pain, facial swelling, hearing loss, mouth sores, nosebleeds, rhinorrhea, sinus pressure, sneezing, sore throat, tinnitus, trouble swallowing and voice change.   Eyes: Negative.   Respiratory: Positive for cough. Negative for apnea, hemoptysis, choking, chest tightness, shortness of breath, wheezing and stridor.   Cardiovascular: Negative.  Negative for chest pain, palpitations and leg swelling.  Gastrointestinal: Negative.  Negative for heartburn and abdominal pain.  Endocrine: Negative.   Genitourinary: Negative.   Musculoskeletal: Negative.  Negative for myalgias.  Skin: Negative.  Negative for color change, pallor, rash and wound.  Allergic/Immunologic: Negative.  Negative for environmental allergies.  Neurological: Positive for dizziness. Negative for tremors, seizures, syncope, facial asymmetry, speech difficulty, weakness,  light-headedness, numbness and headaches.       He has had intermittent episodes of dizziness and fullness in his left ear for the last year.  Hematological: Positive for adenopathy. Does not bruise/bleed easily.       He has had an enlarged painful lymph node on the left side of his neck for several months.  Psychiatric/Behavioral: Negative.        Objective:   Physical Exam  Vitals reviewed. Constitutional: He is oriented to person, place, and time. He appears well-developed and well-nourished. No distress.  HENT:  Head: Normocephalic and atraumatic.  Right Ear: Hearing, tympanic membrane, external ear and ear canal normal.  Left Ear: Hearing, tympanic membrane, external ear and ear canal normal.  Nose: Mucosal edema and rhinorrhea present. No nose lacerations, sinus tenderness, nasal deformity, septal deviation or nasal septal hematoma. No epistaxis.  No foreign bodies. Right sinus exhibits no maxillary sinus tenderness and no frontal sinus tenderness. Left sinus exhibits no maxillary sinus tenderness and no frontal sinus tenderness.  Mouth/Throat: Oropharynx is clear and moist and mucous membranes are normal. Mucous membranes are not pale, not dry and not cyanotic. No oral lesions. No trismus in the jaw. No uvula swelling. No oropharyngeal exudate, posterior oropharyngeal edema, posterior oropharyngeal erythema or tonsillar abscesses.  Eyes: Conjunctivae are normal. Right eye exhibits no discharge. Left eye exhibits no discharge. No scleral icterus.  Neck: Normal range of motion. Neck supple. No JVD present. No tracheal deviation present. No thyromegaly present.  Cardiovascular: Normal rate, regular rhythm, normal heart sounds and intact distal pulses.  Exam reveals no gallop and no friction rub.   No murmur heard. Pulmonary/Chest: Effort normal and breath sounds normal. No stridor. No respiratory distress. He has no wheezes. He has no rales. He exhibits no tenderness.  Abdominal: Soft.  Bowel sounds are normal. He exhibits no  distension and no mass. There is no tenderness. There is no rebound and no guarding.  Musculoskeletal: Normal range of motion. He exhibits no edema and no tenderness.  Lymphadenopathy:       Head (right side): No submental, no submandibular, no tonsillar, no preauricular, no posterior auricular and no occipital adenopathy present.       Head (left side): No submental, no submandibular, no tonsillar, no preauricular, no posterior auricular and no occipital adenopathy present.    He has cervical adenopathy.       Right cervical: No superficial cervical, no deep cervical and no posterior cervical adenopathy present.      Left cervical: Superficial cervical adenopathy present. No deep cervical and no posterior cervical adenopathy present.    He has no axillary adenopathy.       Right axillary: No pectoral and no lateral adenopathy present.       Left axillary: No pectoral and no lateral adenopathy present.      Right: No inguinal, no supraclavicular and no epitrochlear adenopathy present.       Left: No inguinal, no supraclavicular and no epitrochlear adenopathy present.  On the left side there is a 3 cm soft, mildly tender, freely mobile anterior cervical lymph node.  Neurological: He is oriented to person, place, and time.  Skin: Skin is warm and dry. No rash noted. He is not diaphoretic. No erythema. No pallor.  Psychiatric: He has a normal mood and affect. His behavior is normal. Judgment and thought content normal.      Lab Results  Component Value Date   WBC 8.2 11/27/2013   HGB 16.5 11/27/2013   HCT 47.8 11/27/2013   PLT 177.0 11/27/2013   GLUCOSE 83 11/27/2013   CHOL 162 04/01/2010   TRIG 187.0* 04/01/2010   HDL 29.40* 04/01/2010   LDLCALC 95 04/01/2010   ALT 15 11/27/2013   AST 17 11/27/2013   NA 139 11/27/2013   K 4.5 11/27/2013   CL 105 11/27/2013   CREATININE 1.3 11/27/2013   BUN 12 11/27/2013   CO2 28 11/27/2013   TSH 1.37 04/01/2010    INR 1.16 12/16/2011      Assessment & Plan:

## 2013-12-03 ENCOUNTER — Ambulatory Visit (INDEPENDENT_AMBULATORY_CARE_PROVIDER_SITE_OTHER)
Admission: RE | Admit: 2013-12-03 | Discharge: 2013-12-03 | Disposition: A | Discharge status: Home / Self Care | Payer: BC Managed Care – PPO | Source: Ambulatory Visit | Attending: Internal Medicine | Admitting: Internal Medicine

## 2013-12-03 DIAGNOSIS — R599 Enlarged lymph nodes, unspecified: Secondary | ICD-10-CM

## 2013-12-03 DIAGNOSIS — R590 Localized enlarged lymph nodes: Secondary | ICD-10-CM

## 2013-12-03 MED ORDER — IOHEXOL 300 MG/ML  SOLN
76.0000 mL | Freq: Once | INTRAMUSCULAR | Status: AC | PRN
  Administered 2013-12-03: 76 mL via INTRAVENOUS

## 2014-10-04 ENCOUNTER — Encounter: Payer: Self-pay | Admitting: Family Medicine

## 2014-10-04 ENCOUNTER — Ambulatory Visit (INDEPENDENT_AMBULATORY_CARE_PROVIDER_SITE_OTHER): Payer: BC Managed Care – PPO | Admitting: Family Medicine

## 2014-10-04 VITALS — BP 118/82 | Temp 98.3°F | Wt 212.0 lb

## 2014-10-04 DIAGNOSIS — L03311 Cellulitis of abdominal wall: Secondary | ICD-10-CM

## 2014-10-04 MED ORDER — SULFAMETHOXAZOLE-TRIMETHOPRIM 800-160 MG PO TABS: 2.0000 | ORAL_TABLET | Freq: Two times a day (BID) | ORAL | Status: DC

## 2014-10-04 NOTE — Progress Notes (Signed)
   Dr. Karleen HampshireSpencer T. Sakina Briones, MD, CAQ Sports Medicine Primary [facility] and Sports Medicine  10/04/2014  Patient: Christopher Guerrero, MRN: [id number], DOB: December 29, 1965, 48 y.o.  Primary Physician:  Sanda Lingerhomas Jones, MD  Chief Complaint: possible MRSA flare up  Subjective:   Christopher Guerrero is a 48 y.o. very pleasant male patient who presents with the following:  Pain, red near old scar, abd., ? 2 weeks noticed, and then a little worse.  H/o MRSA.  Early cellulitis on scar  The patient is a very pleasant gentleman with a complex surgical history including a perforated diverticulitis with multiple operations, a prior colectomy, and surgical complications and infections and prior MRSA.  He is here today with his wife.  He currently does not have any kind of fever whatsoever.  He does have a small area some redness and tenderness in his abdomen essentially directly around his scar.  Past Medical History, Surgical History, Social History, Family History, Problem List, Medications, and Allergies have been reviewed and updated if relevant.  ROS: GEN: Acute illness details above GI: Tolerating PO intake GU: maintaining adequate hydration and urination Pulm: No SOB Interactive and getting along well at home.  Otherwise, ROS is as per the HPI.   Objective:   BP 118/82 mmHg  Temp(Src) 98.3 F (36.8 C)  Wt 212 lb (96.163 kg)   GEN: WDWN, NAD, Non-toxic, A & O x 3 HEENT: Atraumatic, Normocephalic. Neck supple. No masses, No LAD. Ears and Nose: No external deformity. CV: RRR, No M/G/R. No JVD. No thrill. No extra heart sounds. PULM: CTA B, no wheezes, crackles, rhonchi. No retractions. No resp. distress. No accessory muscle use. ABD: S, NT, ND, + BS, No rebound, No HSM  EXTR: No c/c/e NEURO Normal gait.  PSYCH: Normally interactive. Conversant. Not depressed or anxious appearing.  Calm demeanor.     SKIN: extensive scarring, lower abdomen.There is an area of pink/reddish coloration in the scar that  is roughly the size of a quarter and it is tender to palpation.  There is no surrounding induration, erythema.  There is no fluctuance whatsoever.  Laboratory and Imaging Data:  Assessment and Plan:   Cellulitis, abdominal wall  Right now, this appears to be cellulitis without focal abscess.  Given history of MRSA, I am going to treat the patient with high-dose sulfa.  High risk patient, with history of colostomy, colectomy, and multiple abdominal surgery with complication.  Discussed with the patient and his wife that if he does not improve in relative short order, revisitation and follow-up midweek would be appropriate.  Follow-up: No Follow-up on file.  New Prescriptions   SULFAMETHOXAZOLE-TRIMETHOPRIM (BACTRIM DS,SEPTRA DS) 800-160 MG PER TABLET    Take 2 tablets by mouth 2 (two) times daily.   No orders of the defined types were placed in this encounter.    Signed,  Elpidio GaleaSpencer T. Laparis Durrett, MD   Patient's Medications  New Prescriptions   SULFAMETHOXAZOLE-TRIMETHOPRIM (BACTRIM DS,SEPTRA DS) 800-160 MG PER TABLET    Take 2 tablets by mouth 2 (two) times daily.  Previous Medications   AZELASTINE-FLUTICASONE (DYMISTA) 137-50 MCG/ACT SUSP    Place 1 Act into the nose 2 (two) times daily.  Modified Medications   No medications on file  Discontinued Medications   No medications on file

## 2014-10-06 ENCOUNTER — Ambulatory Visit: Payer: BC Managed Care – PPO | Admitting: Family

## 2015-05-19 LAB — HEPATIC FUNCTION PANEL
ALT: 16 U/L (ref 10–40)
AST: 17 U/L (ref 14–40)
Alkaline Phosphatase: 63 U/L (ref 25–125)
BILIRUBIN DIRECT: 0.08 mg/dL (ref 0.01–0.4)
BILIRUBIN, TOTAL: 0.3 mg/dL

## 2015-05-19 LAB — CBC AND DIFFERENTIAL
HCT: 41 % (ref 41–53)
Hemoglobin: 14.2 g/dL (ref 13.5–17.5)
Platelets: 177 10*3/uL (ref 150–399)
WBC: 5.2 10^3/mL

## 2015-05-19 LAB — BASIC METABOLIC PANEL
BUN: 9 mg/dL (ref 4–21)
Creatinine: 1 mg/dL (ref 0.6–1.3)
GLUCOSE: 89 mg/dL
Potassium: 4.2 mmol/L (ref 3.4–5.3)
SODIUM: 141 mmol/L (ref 137–147)

## 2015-05-19 LAB — TSH: TSH: 3.24 u[IU]/mL (ref 0.41–5.90)

## 2015-05-21 ENCOUNTER — Encounter: Payer: Self-pay | Admitting: Internal Medicine

## 2016-02-12 LAB — HM COLONOSCOPY

## 2016-02-18 ENCOUNTER — Other Ambulatory Visit: Payer: Self-pay | Admitting: Internal Medicine

## 2016-10-02 ENCOUNTER — Encounter (HOSPITAL_COMMUNITY): Payer: Self-pay | Admitting: *Deleted

## 2016-10-02 ENCOUNTER — Emergency Department (HOSPITAL_COMMUNITY)
Admission: EM | Admit: 2016-10-02 | Discharge: 2016-10-03 | Disposition: A | Discharge status: Home / Self Care | Payer: Managed Care, Other (non HMO) | Attending: Emergency Medicine | Admitting: Emergency Medicine

## 2016-10-02 DIAGNOSIS — F17210 Nicotine dependence, cigarettes, uncomplicated: Secondary | ICD-10-CM | POA: Diagnosis not present

## 2016-10-02 DIAGNOSIS — F17290 Nicotine dependence, other tobacco product, uncomplicated: Secondary | ICD-10-CM | POA: Diagnosis not present

## 2016-10-02 DIAGNOSIS — R0789 Other chest pain: Secondary | ICD-10-CM | POA: Insufficient documentation

## 2016-10-02 DIAGNOSIS — R079 Chest pain, unspecified: Secondary | ICD-10-CM

## 2016-10-02 LAB — CBC
HEMATOCRIT: 40.3 % (ref 39.0–52.0)
HEMOGLOBIN: 14.2 g/dL (ref 13.0–17.0)
MCH: 32.9 pg (ref 26.0–34.0)
MCHC: 35.2 g/dL (ref 30.0–36.0)
MCV: 93.3 fL (ref 78.0–100.0)
Platelets: 166 10*3/uL (ref 150–400)
RBC: 4.32 MIL/uL (ref 4.22–5.81)
RDW: 12.4 % (ref 11.5–15.5)
WBC: 6.7 10*3/uL (ref 4.0–10.5)

## 2016-10-02 NOTE — ED Triage Notes (Signed)
Pt c/o chest tightness and sob onset yesterday. Reports feeling tightness from R chest across. Lung sounds clear on assessment.

## 2016-10-02 NOTE — ED Provider Notes (Addendum)
[facility]-EMERGENCY DEPT Provider Note   CSN: [id number] Arrival date & time: 10/02/16  2327  By signing my name below, I, [name], attest that this documentation has been prepared under the direction and in the presence of Shon Batonourtney F Iyad Deroo, MD. Electronically Signed: Angelene GiovanniEmmanuella Mensah, ED Scribe. 10/02/16. 12:35 AM.   History   Chief Complaint Chief Complaint  Patient presents with  . Chest Pain    HPI Comments: Christopher Guerrero is a 50 y.o. male with a hx of GERD who presents to the Emergency Department complaining of gradual onset, intermittent episodes of persistent moderate left chest tightness that lasts for 5-10 minutes onset 4 days ago. He states that his pain is currently 2/10. He notes that the tightness is not necessarily worse on exertion but improves with rest. He reports associated mild shortness of breath, nausea, multiple episode of non-bloody vomiting,, and generalized weakness. Pt has not tried any medications PTA. He has NKDA. He denies a personal hx of MI or CAD but reports a family hx with his father having an MI in his 7340's and 2 CABGs. He denies any fever, chills, diaphoresis, abdominal pain, or any other symptoms.    The history is provided by the patient. No language interpreter was used.    Past Medical History:  Diagnosis Date  . Diverticulitis   . GERD (gastroesophageal reflux disease)   . Gout     Patient Active Problem List   Diagnosis Date Noted  . Allergic rhinitis, cause unspecified 11/27/2013  . Cough 11/27/2013  . Cervical lymphadenopathy 11/27/2013  . Diverticulitis 12/15/2011  . GOUT 08/27/2010  . GERD 11/14/2008    Past Surgical History:  Procedure Laterality Date  . ABDOMINAL DEBRIDEMENT  2009  . APPENDECTOMY  2008  . COLECTOMY  2007   sigmoid colon resection and colostomy  . COLON SURGERY  2008   colostomy closure  . COLON SURGERY  2008   resection of enterocutaneous fistula, small bowel resection, L colon resection with  primary anastomosis, abd wall resection       Home Medications    Prior to Admission medications   Medication Sig Start Date End Date Taking? Authorizing Provider  pantoprazole (PROTONIX) 40 MG tablet Take 40 mg by mouth daily. 09/17/16  Yes Historical Provider, MD  Azelastine-Fluticasone (DYMISTA) 137-50 MCG/ACT SUSP Place 1 Act into the nose 2 (two) times daily. Patient not taking: Reported on 10/03/2016 11/27/13   Christopher Grandchildhomas L Jones, MD  sulfamethoxazole-trimethoprim (BACTRIM DS,SEPTRA DS) 800-160 MG per tablet Take 2 tablets by mouth 2 (two) times daily. Patient not taking: Reported on 10/03/2016 10/04/14   Hannah BeatSpencer Copland, MD    Family History Family History  Problem Relation Age of Onset  . Arthritis Other   . Hypertension Other   . Coronary artery disease Other     Social History Social History  Substance Use Topics  . Smoking status: Current Some Day Smoker    Packs/day: 20.00    Years: 20.00    Types: Cigarettes, Cigars  . Smokeless tobacco: Never Used  . Alcohol use Yes     Allergies   Patient has no known allergies.   Review of Systems Review of Systems  Constitutional: Negative for chills, diaphoresis and fever.  Respiratory: Positive for chest tightness and shortness of breath.   Gastrointestinal: Positive for nausea and vomiting. Negative for abdominal pain.  Neurological: Positive for weakness and headaches.  All other systems reviewed and are negative.    Physical Exam Updated Vital Signs  BP 130/90   Pulse (!) 52   Temp 97.5 F (36.4 C) (Oral)   Resp 15   Ht 5\' 11"  (1.803 m)   Wt 220 lb (99.8 kg)   SpO2 96%   BMI 30.68 kg/m   Physical Exam  Constitutional: He is oriented to person, place, and time. He appears well-developed and well-nourished. No distress.  HENT:  Head: Normocephalic and atraumatic.  Cardiovascular: Normal rate, regular rhythm and normal heart sounds.   No murmur heard. Pulmonary/Chest: Effort normal and breath sounds  normal. No respiratory distress. He has no wheezes. He exhibits no tenderness.  Abdominal: Soft. Bowel sounds are normal. There is no tenderness. There is no rebound.  Musculoskeletal: He exhibits no edema.  Neurological: He is alert and oriented to person, place, and time.  Skin: Skin is warm and dry.  Psychiatric: He has a normal mood and affect.  Nursing note and vitals reviewed.    ED Treatments / Results  DIAGNOSTIC STUDIES: Oxygen Saturation is 99% on RA, normal by my interpretation.    COORDINATION OF CARE: 12:33 AM- Pt advised of plan for treatment and pt agrees. Pt will receive lab work, chest x-ray, and EKG for further evaluation. He will also receive NTG 2% ointment.    Labs (all labs ordered are listed, but only abnormal results are displayed) Labs Reviewed  BASIC METABOLIC PANEL - Abnormal; Notable for the following:       Result Value   Potassium 3.4 (*)    Glucose, Bld 113 (*)    All other components within normal limits  CBC  I-STAT TROPOININ, ED  I-STAT TROPOININ, ED    EKG  EKG Interpretation  Date/Time:  Sunday October 02 2016 23:32:53 EST Ventricular Rate:  69 PR Interval:  150 QRS Duration: 90 QT Interval:  382 QTC Calculation: 409 R Axis:   5 Text Interpretation:  Normal sinus rhythm Normal ECG Confirmed by Wilkie AyeHORTON  MD, Maddyson Keil ([id number]) on 10/02/2016 11:53:35 PM       Radiology Dg Chest 2 View  Result Date: 10/03/2016 CLINICAL DATA:  Chest pain EXAM: CHEST  2 VIEW COMPARISON:  11/27/2013 FINDINGS: The heart size and mediastinal contours are within normal limits. The thoracic aorta is slightly uncoiled in appearance without aneurysm. Both lungs slightly hyperinflated. The visualized skeletal structures are without acute appearing abnormalities. Mild multilevel thoracic disc space narrowing. IMPRESSION: Slightly hyperinflated appearance to the lungs without pneumonic consolidation or CHF. Electronically Signed   By: Tollie Ethavid  Kwon M.D.   On: 10/03/2016  01:10    Procedures Procedures (including critical care time)  Medications Ordered in ED Medications  nitroGLYCERIN (NITROGLYN) 2 % ointment 1 inch (1 inch Topical Given 10/03/16 0037)  gi cocktail (Maalox,Lidocaine,Donnatal) (30 mLs Oral Given 10/03/16 0037)     Initial Impression / Assessment and Plan / ED Course  Shon Batonourtney F Nely Dedmon, MD has reviewed the triage vital signs and the nursing notes.  Pertinent labs & imaging results that were available during my care of the patient were reviewed by me and considered in my medical decision making (see chart for details).  Clinical Course as of Oct 03 320  Mon Oct 03, 2016  0300 Improved with GI cocktail. Repeat troponin pending  [CH]    Clinical Course User Index [CH] Shon Batonourtney F Debhora Titus, MD    Patient presents with chest pain. Intermittent over the last week. Somewhat atypical however is improved with rest. Also history of GERD. He is nontoxic. EKG is nonischemic. Chest x-ray reassuring.  Initial troponin negative. Patient given GI cocktail. Patient's heart score is 3 for story, age, risk factors.  Improved after GI cocktail. Repeat troponin negative. Discussed with patient that he needs to follow-up with both cardiology and GI for formal evaluation. Likely need stress testing and risk stratification. Discussed this with the patient and his wife. They are comfortable with plan. Will discharge home. Given strict return precautions.  4:38 AM Patient of discharge. Became acutely nauseous and dry heaving. Reports that he gets this way in the morning sometimes and related to his reflux. He was given Zofran and Phenergan.  Patient improved and requesting d/c.  Final Clinical Impressions(s) / ED Diagnoses   Final diagnoses:  Nonspecific chest pain    New Prescriptions New Prescriptions   No medications on file   I personally performed the services described in this documentation, which was scribed in my presence. The recorded information  has been reviewed and is accurate.    Shon Baton, MD 10/03/16 [id number]    Shon Baton, MD 10/03/16 [phone]

## 2016-10-03 ENCOUNTER — Emergency Department (HOSPITAL_COMMUNITY): Payer: Managed Care, Other (non HMO)

## 2016-10-03 LAB — BASIC METABOLIC PANEL
Anion gap: 7 (ref 5–15)
BUN: 12 mg/dL (ref 6–20)
CALCIUM: 10.3 mg/dL (ref 8.9–10.3)
CO2: 25 mmol/L (ref 22–32)
CREATININE: 1.17 mg/dL (ref 0.61–1.24)
Chloride: 107 mmol/L (ref 101–111)
GFR calc non Af Amer: >60 mL/min (ref >60)
Glucose, Bld: 113 mg/dL — ABNORMAL HIGH (ref 65–99)
Potassium: 3.4 mmol/L — ABNORMAL LOW (ref 3.5–5.1)
SODIUM: 139 mmol/L (ref 135–145)

## 2016-10-03 LAB — I-STAT TROPONIN, ED
Troponin i, poc: 0 ng/mL (ref 0.00–0.08)
Troponin i, poc: 0 ng/mL (ref 0.00–0.08)

## 2016-10-03 MED ORDER — ONDANSETRON HCL 4 MG PO TABS
4.0000 mg | ORAL_TABLET | Freq: Once | ORAL | Status: DC
  Filled 2016-10-03: qty 1

## 2016-10-03 MED ORDER — ONDANSETRON 4 MG PO TBDP
4.0000 mg | ORAL_TABLET | Freq: Once | ORAL | Status: AC
  Administered 2016-10-03: 4 mg via ORAL
  Filled 2016-10-03: qty 1

## 2016-10-03 MED ORDER — PROMETHAZINE HCL 25 MG PO TABS
25.0000 mg | ORAL_TABLET | Freq: Four times a day (QID) | ORAL | Status: DC | PRN
  Administered 2016-10-03: 25 mg via ORAL
  Filled 2016-10-03: qty 1

## 2016-10-03 MED ORDER — GI COCKTAIL ~~LOC~~
30.0000 mL | Freq: Once | ORAL | Status: AC
  Administered 2016-10-03: 30 mL via ORAL
  Filled 2016-10-03: qty 30

## 2016-10-03 MED ORDER — NITROGLYCERIN 2 % TD OINT
1.0000 [in_us] | TOPICAL_OINTMENT | Freq: Once | TRANSDERMAL | Status: AC
  Administered 2016-10-03: 1 [in_us] via TOPICAL
  Filled 2016-10-03: qty 1

## 2016-10-03 NOTE — ED Notes (Signed)
Pt and family understood dc material. NAD noted 

## 2016-10-03 NOTE — Discharge Instructions (Signed)
You were seen today for chest pain. Your workup is reassuring. However given your risk factors, you need to be reevaluated by cardiology and set up for stress testing. If you have any new or worsening symptoms you need to be reevaluated immediately.

## 2016-10-06 NOTE — Progress Notes (Signed)
Cardiology Office Note   Date:  10/07/2016   ID:  Christopher Guerrero, DOB 08-12-1966, MRN [id number]  PCP:  Maryelizabeth RowanEWEY,ELIZABETH, MD  Cardiologist:   Charlton HawsPeter Brayn Eckstein, MD   Chief Complaint  Patient presents with  . Establish Care      History of Present Illness: Christopher Guerrero is a 50 y.o. male who presents for chest pain evaluation Seen in ER 10/02/16.  Describes gradual onset, intermittent episodes of persistent moderate left chest tightness that lasts for 5-10 minutes onset 09/28/16 . He states that pain was  2/10. He notes that the tightness is not necessarily worse on exertion but improves with rest. He reports associated mild shortness of breath, nausea, multiple episode of non-bloody vomiting,, and generalized weakness. Pt has not tried any medications PTA. He has NKDA. He denies a personal hx of MI or CAD but reports a family hx with his father having an MI in his 5640's and 2 CABGs. He denies any fever, chills, diaphoresis, abdominal pain, or any other symptoms.  Does have history of GERD but no overt GI overtones with pain   R/O no acute ECG chagnes CXR normal Given GI cocktail. Before d/c had nausea and dry heaves Gets this  Way in am from reflux.  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 Diverticulitis   . GERD (gastroesophageal reflux disease)   . Gout     Past Surgical History:  Procedure Laterality Date  . ABDOMINAL DEBRIDEMENT  2009  . APPENDECTOMY  2008  . COLECTOMY  2007   sigmoid colon resection and colostomy  . COLON SURGERY  2008   colostomy closure  . COLON SURGERY  2008   resection of enterocutaneous fistula, small bowel resection, L colon resection with primary anastomosis,  abd wall resection     Current Outpatient Prescriptions  Medication Sig Dispense Refill  . dexlansoprazole (DEXILANT) 60 MG capsule Take 120 mg by mouth daily.     No current facility-administered medications for this visit.     Allergies:   Patient has no known allergies.    Social History:  The patient  reports that he has been smoking Cigarettes and Cigars.  He has a 400.00 pack-year smoking history. He has never used smokeless tobacco. He reports that he drinks alcohol. He reports that he does not use drugs.   Family History:  The patient's family history includes Arthritis in his other; Coronary artery disease in his other; Hypertension in his other.    ROS:  Please see the history of present illness.   Otherwise, review of systems are positive for none.   All other systems are reviewed and negative.    PHYSICAL EXAM: VS:  BP 130/80   Pulse 84   Ht 5\' 11"  (1.803 m)   Wt 97 kg (213 lb 12.8 oz)   SpO2 97%   BMI 29.82 kg/m  , BMI Body mass index is 29.82 kg/m. Affect appropriate Healthy:  appears stated age HEENT: normal Neck supple with no adenopathy JVP normal no bruits no thyromegaly Lungs clear with no wheezing and good diaphragmatic motion Heart:  S1/S2 no murmur, no rub, gallop  or click PMI normal Abdomen: benighn, BS positve, no tenderness, no AAA no bruit.  No HSM or HJR Distal pulses intact with no bruits No edema Neuro non-focal Skin warm and dry No muscular weakness    EKG  10/03/16  SR rate 69 normal    Recent Labs: 10/02/2016: BUN 12; Creatinine, Ser 1.17; Hemoglobin 14.2; Platelets 166; Potassium 3.4; Sodium 139    Lipid Panel    Component Value Date/Time   CHOL 162 04/01/2010 1023   TRIG 187.0 (H) 04/01/2010 1023   HDL 29.40 (L) 04/01/2010 1023   CHOLHDL 6 04/01/2010 1023   VLDL 37.4 04/01/2010 1023   LDLCALC 95 04/01/2010 1023      Wt Readings from Last 3 Encounters:  10/07/16 97 kg (213 lb 12.8 oz)  10/02/16 99.8 kg (220 lb)    10/04/14 96.2 kg (212 lb)      Other studies Reviewed: Additional studies/ records that were reviewed today include: ER notes ECG labs and CXR .    ASSESSMENT AND PLAN:  1.  Chest Pain: atypical features family history normal ECG f/u ETT discussed utility of calcium Score as well for 5 yr risk stratification  2. GERD:  Refer GI continue dexilant may benefit from EGD 3. Diverticulitis:  Diet change helping    Current medicines are reviewed at length with the patient today.  The patient does not have concerns regarding medicines.  The following changes have been made:  no change  Labs/ tests ordered today include: ETT Calcium Score   Orders Placed This Encounter  Procedures  . CT CARDIAC SCORING  . EXERCISE TOLERANCE TEST     Disposition:   FU with me next available      Signed, Charlton HawsPeter Ludmilla Mcgillis, MD  10/07/2016 3:42 PM    [facility] [address], [place], [place]  [address] Phone: [phone](336) [phone]; Fax: [phone]) [phone]

## 2016-10-07 ENCOUNTER — Ambulatory Visit (INDEPENDENT_AMBULATORY_CARE_PROVIDER_SITE_OTHER): Payer: Managed Care, Other (non HMO) | Admitting: Cardiovascular Disease

## 2016-10-07 ENCOUNTER — Encounter: Payer: Self-pay | Admitting: Cardiovascular Disease

## 2016-10-07 ENCOUNTER — Encounter (INDEPENDENT_AMBULATORY_CARE_PROVIDER_SITE_OTHER): Payer: Self-pay

## 2016-10-07 VITALS — BP 130/80 | HR 84 | Ht 71.0 in | Wt 213.8 lb

## 2016-10-07 DIAGNOSIS — Z7689 Persons encountering health services in other specified circumstances: Secondary | ICD-10-CM

## 2016-10-07 DIAGNOSIS — R0789 Other chest pain: Secondary | ICD-10-CM

## 2016-10-07 NOTE — Patient Instructions (Addendum)
Medication Instructions:  Your physician recommends that you continue on your current medications as directed. Please refer to the Current Medication list given to you today.  Labwork: NONE  Testing/Procedures: Your physician has requested that you have an exercise tolerance test. For further information please visit [email or web address]. Please also follow instruction sheet, as given.  Cardiac CT scanning Calcium score, (CAT scanning), is a noninvasive, special x-ray that produces cross-sectional images of the body using x-rays and a computer. CT scans help physicians diagnose and treat medical conditions. For some CT exams, a contrast material is used to enhance visibility in the area of the body being studied. CT scans provide greater clarity and reveal more details than regular x-ray exams.  Follow-Up: Your physician wants you to follow-up with Dr. Eden EmmsNishan next available.   If you need a refill on your cardiac medications before your next appointment, please call your pharmacy.

## 2016-10-14 ENCOUNTER — Encounter: Payer: Self-pay | Admitting: Cardiovascular Disease

## 2016-10-25 ENCOUNTER — Ambulatory Visit (INDEPENDENT_AMBULATORY_CARE_PROVIDER_SITE_OTHER)
Admission: RE | Admit: 2016-10-25 | Discharge: 2016-10-25 | Disposition: A | Discharge status: Home / Self Care | Payer: Self-pay | Source: Ambulatory Visit | Attending: Cardiovascular Disease | Admitting: Cardiovascular Disease

## 2016-10-25 ENCOUNTER — Ambulatory Visit (INDEPENDENT_AMBULATORY_CARE_PROVIDER_SITE_OTHER): Payer: Managed Care, Other (non HMO)

## 2016-10-25 DIAGNOSIS — Z7689 Persons encountering health services in other specified circumstances: Secondary | ICD-10-CM

## 2016-10-25 DIAGNOSIS — R0789 Other chest pain: Secondary | ICD-10-CM | POA: Diagnosis not present

## 2016-10-25 LAB — EXERCISE TOLERANCE TEST
CSEPEDS: 0 s
CSEPEW: 11.7 METS
CSEPPHR: 157 {beats}/min
Exercise duration (min): 10 min
MPHR: 170 {beats}/min
Percent HR: 92 %
RPE: 16
Rest HR: 71 {beats}/min

## 2016-10-26 ENCOUNTER — Telehealth: Payer: Self-pay

## 2016-10-26 DIAGNOSIS — R931 Abnormal findings on diagnostic imaging of heart and coronary circulation: Secondary | ICD-10-CM

## 2016-10-26 NOTE — Telephone Encounter (Signed)
Patient coming in on Friday for lab work, per Dr. Eden EmmsNishan. Patient aware of Cardiac Ca Score results.

## 2016-10-28 ENCOUNTER — Other Ambulatory Visit: Payer: Managed Care, Other (non HMO) | Admitting: *Deleted

## 2016-10-28 DIAGNOSIS — R931 Abnormal findings on diagnostic imaging of heart and coronary circulation: Secondary | ICD-10-CM

## 2016-10-28 LAB — LIPID PANEL
Cholesterol: 153 mg/dL (ref <200)
HDL: 30 mg/dL — AB (ref >40)
LDL Cholesterol: 95 mg/dL (ref <100)
TRIGLYCERIDES: 142 mg/dL (ref <150)
Total CHOL/HDL Ratio: 5.1 Ratio — ABNORMAL HIGH (ref <5.0)
VLDL: 28 mg/dL (ref <30)

## 2016-10-28 LAB — HEPATIC FUNCTION PANEL
ALT: 19 U/L (ref 9–46)
AST: 19 U/L (ref 10–35)
Albumin: 3.9 g/dL (ref 3.6–5.1)
Alkaline Phosphatase: 55 U/L (ref 40–115)
BILIRUBIN DIRECT: 0.1 mg/dL (ref <=0.2)
BILIRUBIN INDIRECT: 0.4 mg/dL (ref 0.2–1.2)
Total Bilirubin: 0.5 mg/dL (ref 0.2–1.2)
Total Protein: 5.8 g/dL — ABNORMAL LOW (ref 6.1–8.1)

## 2017-01-04 ENCOUNTER — Ambulatory Visit: Payer: Managed Care, Other (non HMO) | Admitting: Cardiovascular Disease

## 2017-01-16 NOTE — Progress Notes (Deleted)
Cardiology Office Note   Date:  01/16/2017   ID:  Christopher Guerrero, DOB December 07, 1965, MRN [id number]  PCP:  Maryelizabeth RowanEWEY,ELIZABETH, MD  Cardiologist:   Charlton HawsPeter Berry Godsey, MD   No chief complaint on file.     History of Present Illness: Christopher Guerrero is a 51 y.o. male who presents for chest pain f.u  Seen in ER 10/02/16.  Describes gradual onset, intermittent episodes of persistent moderate left chest tightness that lasts for 5-10 minutes onset 09/28/16 . He states that pain was  2/10. He notes that the tightness is not necessarily worse on exertion but improves with rest. He reports associated mild shortness of breath, nausea, multiple episode of non-bloody vomiting,, and generalized weakness. Pt has not tried any medications PTA. He has NKDA. He denies a personal hx of MI or CAD but reports a family hx with his father having an MI in his 7340's and 2 CABGs. He denies any fever, chills, diaphoresis, abdominal pain, or any other symptoms.  Does have history of GERD but no overt GI overtones with pain   R/O no acute ECG chagnes CXR normal Given GI cocktail. Before d/c had nausea and dry heaves Gets this  Way in am from reflux.  Rx with Zofran and Phenergan   Cut  back on soda Had been drinking 2 2L bottles of Mountain Dew daily   10/25/16  Normal ETT Calcium Score 40 66th percentile   Lab Results  Component Value Date   LDLCALC 95 10/28/2016     Past Medical History:  Diagnosis Date  . Diverticulitis   . GERD (gastroesophageal reflux disease)   . Gout     Past Surgical History:  Procedure Laterality Date  . ABDOMINAL DEBRIDEMENT  2009  . APPENDECTOMY  2008  . COLECTOMY  2007   sigmoid colon resection and colostomy  . COLON SURGERY  2008   colostomy closure  . COLON SURGERY  2008   resection of enterocutaneous fistula, small bowel resection, L colon resection with primary anastomosis, abd wall resection     Current Outpatient Prescriptions  Medication Sig Dispense Refill  .  dexlansoprazole (DEXILANT) 60 MG capsule Take 120 mg by mouth daily.     No current facility-administered medications for this visit.     Allergies:   Patient has no known allergies.    Social History:  The patient  reports that he has been smoking Cigarettes and Cigars.  He has a 400.00 pack-year smoking history. He has never used smokeless tobacco. He reports that he drinks alcohol. He reports that he does not use drugs.   Family History:  The patient's family history includes Arthritis in his other; Coronary artery disease in his other; Hypertension in his other.    ROS:  Please see the history of present illness.   Otherwise, review of systems are positive for none.   All other systems are reviewed and negative.    PHYSICAL EXAM: VS:  There were no vitals taken for this visit. , BMI There is no height or weight on file to calculate BMI. Affect appropriate Healthy:  appears stated age HEENT: normal Neck supple with no adenopathy JVP normal no bruits no thyromegaly Lungs clear with no wheezing and good diaphragmatic motion Heart:  S1/S2 no murmur, no rub, gallop or click PMI normal Abdomen: benighn, BS positve, no tenderness, no AAA no bruit.  No HSM or HJR Distal pulses intact with no bruits No edema Neuro non-focal Skin warm and dry  No muscular weakness    EKG  10/03/16  SR rate 69 normal    Recent Labs: 10/02/2016: BUN 12; Creatinine, Ser 1.17; Hemoglobin 14.2; Platelets 166; Potassium 3.4; Sodium 139 10/28/2016: ALT 19    Lipid Panel    Component Value Date/Time   CHOL 153 10/28/2016 0759   TRIG 142 10/28/2016 0759   HDL 30 (L) 10/28/2016 0759   CHOLHDL 5.1 (H) 10/28/2016 0759   VLDL 28 10/28/2016 0759   LDLCALC 95 10/28/2016 0759      Wt Readings from Last 3 Encounters:  10/07/16 213 lb 12.8 oz (97 kg)  10/02/16 220 lb (99.8 kg)  10/04/14 212 lb (96.2 kg)      Other studies Reviewed: Additional studies/ records that were reviewed today include: ER  notes ECG labs and CXR .    ASSESSMENT AND PLAN:  1.  Chest Pain: normal ETT and calcium score 40 continue risk factor modification  2. GERD:  Refer GI continue dexilant may benefit from EGD 3. Diverticulitis:  Diet change helping  4. Chol:  At goal for patient with no documented vascular dx Lab Results  Component Value Date   LDLCALC 95 10/28/2016     Current medicines are reviewed at length with the patient today.  The patient does not have concerns regarding medicines.  The following changes have been made:  no change  Labs/ tests ordered today include   No orders of the defined types were placed in this encounter.    Disposition:   FU with me in a year     Signed, Charlton Haws, MD  01/16/2017 12:14 PM    [facility] [address] [place], [place], [place]  [address] Phone: [phone]; Fax: [phone]

## 2017-01-27 ENCOUNTER — Ambulatory Visit: Payer: Managed Care, Other (non HMO) | Admitting: Cardiovascular Disease

## 2017-04-04 ENCOUNTER — Telehealth: Payer: Self-pay

## 2017-04-04 NOTE — Telephone Encounter (Signed)
Called patient's wife (DPR) about making a follow-up appointment for lab work. Patient also needs a follow-up office visit. Patient's wife has cancelled twice due to patient's work. Patient's wife was agreeable to follow-up appointment in August and will have lab work done at that visit. Patient's wife verbalized understanding.

## 2017-04-04 NOTE — Telephone Encounter (Signed)
-----   Message from Ethelda ChickPamela Pate Ingalls, RN sent at 10/31/2016  3:44 PM EST ----- Needs labs in May or June for lipid and liver panel.

## 2017-06-27 NOTE — Progress Notes (Signed)
Cardiology Office Note   Date:  06/30/2017   ID:  Christopher MurdochBarry F Cocozza, DOB 08-15-1966, MRN [id number]  PCP:  Lewis Moccasinewey, Elizabeth R, MD  Cardiologist:   Charlton HawsPeter Ayline Dingus, MD   No chief complaint on file.     History of Present Illness: Christopher Guerrero is a 51 y.o. male who presents for f/u  chest pain evaluation Seen in ER 10/02/16.  Describes gradual onset, intermittent episodes of persistent moderate left chest tightness that lasts for 5-10 minutes onset 09/28/16 . He states that pain was  2/10. He notes that the tightness is not necessarily worse on exertion but improves with rest. He reports associated mild shortness of breath, nausea, multiple episode of non-bloody vomiting,, and generalized weakness. Pt has not tried any medications PTA. He has NKDA. He denies a personal hx of MI or CAD but reports a family hx with his father having an MI in his 3740's and 2 CABGs. He denies any fever, chills, diaphoresis, abdominal pain, or any other symptoms.  Does have history of GERD but no overt GI overtones with pain   R/O no acute ECG chagnes CXR normal Given GI cocktail. Before d/c had nausea and dry heaves Gets this  Way in am from reflux.  Rx with Zofran and Phenergan  ETT: 10/25/16 normal reviewed Calcium Score 10/25/16 40 66 th percentile   Son Valentina LucksGriffin will start [employer] this year [place] [facility] [place]  Lab Results  Component Value Date   [facility] 10/28/2016     Past Medical History:  Diagnosis Date  . Diverticulitis   . GERD (gastroesophageal reflux disease)   . Gout     Past Surgical History:  Procedure Laterality Date  . ABDOMINAL DEBRIDEMENT  2009  . APPENDECTOMY  2008  . COLECTOMY  2007   sigmoid colon resection and colostomy  . COLON SURGERY  2008   colostomy closure  . COLON SURGERY  2008   resection of enterocutaneous fistula, small bowel resection, L colon resection with primary anastomosis, abd wall resection     Current Outpatient Prescriptions   Medication Sig Dispense Refill  . dexlansoprazole (DEXILANT) 60 MG capsule Take 120 mg by mouth daily.    . pantoprazole (PROTONIX) 40 MG tablet Take 1 tablet by mouth daily.  12   No current facility-administered medications for this visit.     Allergies:   Patient has no known allergies.    Social History:  The patient  reports that he has been smoking Cigarettes and Cigars.  He has a 400.00 pack-year smoking history. He has never used smokeless tobacco. He reports that he drinks alcohol. He reports that he does not use drugs.   Family History:  The patient's family history includes Arthritis in his other; Coronary artery disease in his other; Hypertension in his other.    ROS:  Please see the history of present illness.   Otherwise, review of systems are positive for none.   All other systems are reviewed and negative.    PHYSICAL EXAM: VS:  BP 120/88 (BP Location: Right Arm, Patient Position: Sitting, Cuff Size: Large)   Pulse 86   Ht 5\' 11"  (1.803 m)   Wt 216 lb (98 kg)   SpO2 99%   BMI 30.13 kg/m  , BMI Body mass index is 30.13 kg/m. Affect appropriate Healthy:  appears stated age HEENT: normal Neck supple with no adenopathy JVP normal no bruits no thyromegaly Lungs clear with no  wheezing and good diaphragmatic motion Heart:  S1/S2 no murmur, no rub, gallop or click PMI normal Abdomen: benighn, BS positve, no tenderness, no AAA no bruit.  No HSM or HJR Distal pulses intact with no bruits No edema Neuro non-focal Skin warm and dry No muscular weakness    EKG  10/03/16  SR rate 69 normal    Recent Labs: 10/02/2016: BUN 12; Creatinine, Ser 1.17; Hemoglobin 14.2; Platelets 166; Potassium 3.4; Sodium 139 10/28/2016: ALT 19    Lipid Panel    Component Value Date/Time   CHOL 153 10/28/2016 0759   TRIG 142 10/28/2016 0759   HDL 30 (L) 10/28/2016 0759   CHOLHDL 5.1 (H) 10/28/2016 0759   VLDL 28 10/28/2016 0759   LDLCALC 95 10/28/2016 0759      Wt Readings  from Last 3 Encounters:  06/30/17 216 lb (98 kg)  10/07/16 213 lb 12.8 oz (97 kg)  10/02/16 220 lb (99.8 kg)      Other studies Reviewed: Additional studies/ records that were reviewed today include: ER notes ECG labs and CXR .    ASSESSMENT AND PLAN:  1.  Chest Pain: atypical features family history normal ECG  Normal eTT 09/2016 observe  2. GERD:  Refer GI continue dexilant may benefit from EGD 3. Diverticulitis:  Diet change helping  4. Cholesterol LDL 95 on no meds given age would not commit to decades of statin Rx repeat calcium score 2020 If marked progression can consider Will repeat labs today since he is fasting and does not have f/u with Dr Duanne Guessewey yet   Current medicines are reviewed at length with the patient today.  The patient does not have concerns regarding medicines.  The following changes have been made:  no change  Labs/ tests ordered today include: Lipid and liver   Orders Placed This Encounter  Procedures  . Hepatic function panel  . Lipid panel     Disposition:   FU with me next available      Signed, Charlton HawsPeter Stryker Veasey, MD  06/30/2017 8:59 AM    [facility] [address], [place], [place]  [address] Phone: [phone]; Fax: [phone]

## 2017-06-30 ENCOUNTER — Ambulatory Visit (INDEPENDENT_AMBULATORY_CARE_PROVIDER_SITE_OTHER): Payer: Managed Care, Other (non HMO) | Admitting: Cardiovascular Disease

## 2017-06-30 ENCOUNTER — Encounter: Payer: Self-pay | Admitting: Cardiovascular Disease

## 2017-06-30 ENCOUNTER — Encounter (INDEPENDENT_AMBULATORY_CARE_PROVIDER_SITE_OTHER): Payer: Self-pay

## 2017-06-30 VITALS — BP 120/88 | HR 86 | Ht 71.0 in | Wt 216.0 lb

## 2017-06-30 DIAGNOSIS — Z79899 Other long term (current) drug therapy: Secondary | ICD-10-CM

## 2017-06-30 DIAGNOSIS — E7800 Pure hypercholesterolemia, unspecified: Secondary | ICD-10-CM | POA: Diagnosis not present

## 2017-06-30 LAB — HEPATIC FUNCTION PANEL
ALBUMIN: 4.3 g/dL (ref 3.5–5.5)
ALT: 14 IU/L (ref 0–44)
AST: 18 IU/L (ref 0–40)
Alkaline Phosphatase: 63 IU/L (ref 39–117)
Bilirubin Total: 0.6 mg/dL (ref 0.0–1.2)
Bilirubin, Direct: 0.15 mg/dL (ref 0.00–0.40)
TOTAL PROTEIN: 6.2 g/dL (ref 6.0–8.5)

## 2017-06-30 LAB — LIPID PANEL
CHOL/HDL RATIO: 4.8 ratio (ref 0.0–5.0)
CHOLESTEROL TOTAL: 168 mg/dL (ref 100–199)
HDL: 35 mg/dL — ABNORMAL LOW (ref >39)
LDL CALC: 104 mg/dL — AB (ref 0–99)
TRIGLYCERIDES: 147 mg/dL (ref 0–149)
VLDL CHOLESTEROL CAL: 29 mg/dL (ref 5–40)

## 2017-06-30 NOTE — Patient Instructions (Addendum)
Medication Instructions:  Your physician recommends that you continue on your current medications as directed. Please refer to the Current Medication list given to you today.  Labwork: Your physician recommends that you return for fasting lab work- Lipid and liver panel   Testing/Procedures: NONE  Follow-Up: Your physician wants you to follow-up in: 12 months with Dr. Eden EmmsNishan. You will receive a reminder letter in the mail two months in advance. If you don't receive a letter, please call our office to schedule the follow-up appointment.   If you need a refill on your cardiac medications before your next appointment, please call your pharmacy.

## 2018-01-18 ENCOUNTER — Encounter: Payer: Self-pay | Admitting: Podiatry

## 2018-01-18 ENCOUNTER — Ambulatory Visit (INDEPENDENT_AMBULATORY_CARE_PROVIDER_SITE_OTHER): Payer: Managed Care, Other (non HMO)

## 2018-01-18 ENCOUNTER — Ambulatory Visit: Payer: Managed Care, Other (non HMO) | Admitting: Podiatry

## 2018-01-18 DIAGNOSIS — M722 Plantar fascial fibromatosis: Secondary | ICD-10-CM | POA: Diagnosis not present

## 2018-01-18 DIAGNOSIS — M2012 Hallux valgus (acquired), left foot: Secondary | ICD-10-CM

## 2018-01-18 NOTE — Progress Notes (Signed)
Subjective:    Patient ID: Christopher Guerrero, male    DOB: [birth year], 52 y.o.   MRN: [id number]  HPI  Chief Complaint  Patient presents with  . Bunions    Left foot - medial side - used to hurt in the past but doesn't bother me much now  . Foot Pain    Right, bottom of heel x 6 months.   52 year old male presents the office today for concerns of pain to the bottom of his right heel which is been ongoing for about 6 months.  He states that on the left side he has a bunion and it was painful when he was diagnosed with gout previously but he currently no longer has any pain to the left foot.  His main concern today is the bottom of the right heel.  He denies any recent injury or trauma.  No numbness or tingling.  The pain is not waking up at night.  He said no recent treatment for this.  He has no other concerns today.    Review of Systems  All other systems reviewed and are negative.  Past Medical History:  Diagnosis Date  . Diverticulitis   . GERD (gastroesophageal reflux disease)   . Gout     Past Surgical History:  Procedure Laterality Date  . ABDOMINAL DEBRIDEMENT  2009  . APPENDECTOMY  2008  . COLECTOMY  2007   sigmoid colon resection and colostomy  . COLON SURGERY  2008   colostomy closure  . COLON SURGERY  2008   resection of enterocutaneous fistula, small bowel resection, L colon resection with primary anastomosis, abd wall resection     Current Outpatient Medications:  .  dexlansoprazole (DEXILANT) 60 MG capsule, Take 120 mg by mouth daily., Disp: , Rfl:  .  ondansetron (ZOFRAN) 4 MG tablet, TK 1 TO 2 TS PO Q 6 TO 8 H PRF NAUSEA, Disp: , Rfl: 0 .  pantoprazole (PROTONIX) 40 MG tablet, Take 1 tablet by mouth daily., Disp: , Rfl: 12  No Known Allergies  Social History   Socioeconomic History  . Marital status: Married    Spouse name: Not on file  . Number of children: Not on file  . Years of education: Not on file  . Highest education level: Not on file    Social Needs  . Financial resource strain: Not on file  . Food insecurity - worry: Not on file  . Food insecurity - inability: Not on file  . Transportation needs - medical: Not on file  . Transportation needs - non-medical: Not on file  Occupational History  . Not on file  Tobacco Use  . Smoking status: Current Some Day Smoker    Packs/day: 20.00    Years: 20.00    Pack years: 400.00    Types: Cigarettes, Cigars  . Smokeless tobacco: Never Used  Substance and Sexual Activity  . Alcohol use: Yes  . Drug use: No  . Sexual activity: Yes  Other Topics Concern  . Not on file  Social History Narrative  . Not on file        Objective:   Physical Exam General: AAO x3, NAD  Dermatological: Skin is warm, dry and supple bilateral. Nails x 10 are well manicured; remaining integument appears unremarkable at this time. There are no open sores, no preulcerative lesions, no rash or signs of infection present.  Vascular: Dorsalis Pedis artery and Posterior Tibial artery pedal pulses are 2/4 bilateral with immedate  capillary fill time.  There is no pain with calf compression, swelling, warmth, erythema.   Neruologic: Grossly intact via light touch bilateral.Protective threshold with Semmes Wienstein monofilament intact to all pedal sites bilateral.   Musculoskeletal: Tenderness to palpation along the plantar medial tubercle of the calcaneus at the insertion of plantar fascia on the right  foot. There is no pain along the course of the plantar fascia within the arch of the foot. Plantar fascia appears to be intact. There is no pain with lateral compression of the calcaneus or pain with vibratory sensation. There is no pain along the course or insertion of the achilles tendon. No other areas of tenderness to bilateral lower extremities.  Moderate HAV is present left foot without any pain.  No pain or crepitation with MPJ range of motion.  Muscular strength 5/5 in all groups tested  bilateral.  Gait: Unassisted, Nonantalgic.     Assessment & Plan:  52 year old male with right heel pain likely plantar fasciitis; a symptom medic left HAV -Treatment options discussed including all alternatives, risks, and complications -Etiology of symptoms were discussed -X-rays were obtained and reviewed with the patient.  No evidence of acute fracture identified today. -Steroid injection was performed.  See procedure note below. -Plantar fascial brace dispensed -We discussed stretching, icing exercises.  Also discussed the change in shoes as well as inserts. -We will continue to monitor left side but currently asymptomatic -RTC 3 weeks or sooner if needed.  He has no further questions or concerns today.  Procedure: Injection Tendon/Ligament Discussed alternatives, risks, complications and verbal consent was obtained.  Location: Right plantar fascia at the glabrous junction; medial approach. Skin Prep: Alcohol. Injectate: 0.5 cc 0.5% marcaine plain, 0.5 cc 0.5% Marcaine plain and, 1 cc kenalog 10. Disposition: Patient tolerated procedure well. Injection site dressed with a band-aid.  Post-injection care was discussed and return precautions discussed.    Vivi Barrack DPM

## 2018-01-18 NOTE — Patient Instructions (Signed)

## 2018-01-21 DIAGNOSIS — M722 Plantar fascial fibromatosis: Secondary | ICD-10-CM | POA: Insufficient documentation

## 2018-01-23 ENCOUNTER — Ambulatory Visit: Payer: Managed Care, Other (non HMO) | Admitting: Orthotics

## 2018-01-23 DIAGNOSIS — M722 Plantar fascial fibromatosis: Secondary | ICD-10-CM

## 2018-01-23 NOTE — Progress Notes (Signed)
Patient came into today for casting bilateral f/o to address plantar fasciitis.  Patient reports history of foot pain involving plantar aponeurosis.  Goal is to provide longitudinal arch support and correct any RF instability due to heel eversion/inversion.  Ultimate goal is to relieve tension at pf insertion calcaneal tuberosity.  Plan on semi-rigid device addressing heel stability and relieving PF tension.      DAWN to verify insurance before order is finalized with [facility]

## 2018-01-25 ENCOUNTER — Telehealth: Payer: Self-pay | Admitting: Podiatry

## 2018-01-25 NOTE — Telephone Encounter (Signed)
[facility] and pts wife aware it will take 3 to 4 weeks for them to come in.

## 2018-01-25 NOTE — Telephone Encounter (Signed)
Spoke to pts wife and she is aware orthotics not covered and they would cost 300.00. She said pt is going to want them and to go ahead with the order.

## 2018-01-26 DIAGNOSIS — M79673 Pain in unspecified foot: Secondary | ICD-10-CM

## 2018-03-12 ENCOUNTER — Ambulatory Visit (INDEPENDENT_AMBULATORY_CARE_PROVIDER_SITE_OTHER): Payer: Managed Care, Other (non HMO) | Admitting: Orthotics

## 2018-03-12 DIAGNOSIS — M722 Plantar fascial fibromatosis: Secondary | ICD-10-CM

## 2018-03-12 NOTE — Progress Notes (Signed)
Patient came in today to pick up custom made foot orthotics.  The goals were accomplished and the patient reported no dissatisfaction with said orthotics.  Patient was advised of breakin period and how to report any issues. 

## 2018-06-06 ENCOUNTER — Telehealth: Payer: Self-pay | Admitting: *Deleted

## 2018-06-06 NOTE — Telephone Encounter (Signed)
   [facility] Pre-operative Risk Assessment    Request for surgical clearance:  1. What type of surgery is being performed? EGD  2. When is this surgery scheduled? 06/08/2018   3. What type of clearance is required (medical clearance vs. Pharmacy clearance to hold med vs. Both)? Both   4. Are there any medications that need to be held prior to surgery and how long? Pending Provider Evaluation   5. Practice name and name of physician performing surgery? [facility], [place], Dr, Mar Daring Nat Mann  6. What is your office phone number [phone]   7.   What is your office fax number [phone]  8.   Anesthesia type (None, local, MAC, general) ? Propofol   Christopher Guerrero 06/06/2018, 5:50 PM  _________________________________________________________________   (provider comments below)

## 2018-06-08 NOTE — Telephone Encounter (Signed)
   Primary Cardiologist: Charlton HawsPeter Nishan, MD  Chart reviewed as part of pre-operative protocol coverage. Patient was contacted 06/08/2018 in reference to pre-operative risk assessment for pending surgery as outlined below.  Christopher MurdochBarry F Calkin was last seen on 06/30/2017 by Dr Eden EmmsNishan.  Since that day, Christopher Guerrero has done well with no new cardiac compaints. He has no hx of MI, CHF or stroke.  Therefore, based on ACC/AHA guidelines, the patient would be at acceptable risk for the planned procedure without further cardiovascular testing.   I will route this recommendation to the requesting party via Epic fax function and remove from pre-op pool.  Please call with questions.  Berton BonJanine Jaren Kearn, NP 06/08/2018, 11:36 AM

## 2020-05-06 ENCOUNTER — Encounter: Payer: Self-pay | Admitting: Cardiovascular Disease

## 2020-08-27 DIAGNOSIS — R7301 Impaired fasting glucose: Secondary | ICD-10-CM | POA: Diagnosis not present

## 2020-08-27 DIAGNOSIS — Z23 Encounter for immunization: Secondary | ICD-10-CM | POA: Diagnosis not present

## 2020-08-27 DIAGNOSIS — D510 Vitamin B12 deficiency anemia due to intrinsic factor deficiency: Secondary | ICD-10-CM | POA: Diagnosis not present

## 2020-08-27 DIAGNOSIS — E785 Hyperlipidemia, unspecified: Secondary | ICD-10-CM | POA: Diagnosis not present

## 2020-08-27 DIAGNOSIS — M109 Gout, unspecified: Secondary | ICD-10-CM | POA: Diagnosis not present

## 2020-09-09 DIAGNOSIS — D290 Benign neoplasm of penis: Secondary | ICD-10-CM | POA: Diagnosis not present

## 2020-09-09 DIAGNOSIS — L821 Other seborrheic keratosis: Secondary | ICD-10-CM | POA: Diagnosis not present

## 2020-09-09 DIAGNOSIS — D0461 Carcinoma in situ of skin of right upper limb, including shoulder: Secondary | ICD-10-CM | POA: Diagnosis not present

## 2020-09-09 DIAGNOSIS — L814 Other melanin hyperpigmentation: Secondary | ICD-10-CM | POA: Diagnosis not present

## 2020-09-09 DIAGNOSIS — D485 Neoplasm of uncertain behavior of skin: Secondary | ICD-10-CM | POA: Diagnosis not present

## 2021-06-13 ENCOUNTER — Inpatient Hospital Stay (HOSPITAL_BASED_OUTPATIENT_CLINIC_OR_DEPARTMENT_OTHER)
Admission: EM | Admit: 2021-06-13 | Discharge: 2021-06-15 | DRG: 247 | Disposition: A | Discharge status: Home / Self Care | Payer: BC Managed Care – PPO | Attending: Internal Medicine | Admitting: Internal Medicine

## 2021-06-13 ENCOUNTER — Encounter (HOSPITAL_BASED_OUTPATIENT_CLINIC_OR_DEPARTMENT_OTHER): Payer: Self-pay

## 2021-06-13 ENCOUNTER — Emergency Department (HOSPITAL_BASED_OUTPATIENT_CLINIC_OR_DEPARTMENT_OTHER): Payer: BC Managed Care – PPO

## 2021-06-13 DIAGNOSIS — Z20822 Contact with and (suspected) exposure to covid-19: Secondary | ICD-10-CM | POA: Diagnosis present

## 2021-06-13 DIAGNOSIS — R001 Bradycardia, unspecified: Secondary | ICD-10-CM | POA: Diagnosis not present

## 2021-06-13 DIAGNOSIS — E785 Hyperlipidemia, unspecified: Secondary | ICD-10-CM | POA: Diagnosis not present

## 2021-06-13 DIAGNOSIS — F17210 Nicotine dependence, cigarettes, uncomplicated: Secondary | ICD-10-CM | POA: Diagnosis not present

## 2021-06-13 DIAGNOSIS — Z95820 Peripheral vascular angioplasty status with implants and grafts: Secondary | ICD-10-CM

## 2021-06-13 DIAGNOSIS — Z79899 Other long term (current) drug therapy: Secondary | ICD-10-CM | POA: Diagnosis not present

## 2021-06-13 DIAGNOSIS — I10 Essential (primary) hypertension: Secondary | ICD-10-CM | POA: Diagnosis present

## 2021-06-13 DIAGNOSIS — M109 Gout, unspecified: Secondary | ICD-10-CM | POA: Diagnosis present

## 2021-06-13 DIAGNOSIS — I2510 Atherosclerotic heart disease of native coronary artery without angina pectoris: Secondary | ICD-10-CM | POA: Diagnosis not present

## 2021-06-13 DIAGNOSIS — I214 Non-ST elevation (NSTEMI) myocardial infarction: Principal | ICD-10-CM | POA: Diagnosis present

## 2021-06-13 DIAGNOSIS — Z8249 Family history of ischemic heart disease and other diseases of the circulatory system: Secondary | ICD-10-CM | POA: Diagnosis not present

## 2021-06-13 DIAGNOSIS — R079 Chest pain, unspecified: Secondary | ICD-10-CM | POA: Diagnosis not present

## 2021-06-13 DIAGNOSIS — K219 Gastro-esophageal reflux disease without esophagitis: Secondary | ICD-10-CM | POA: Diagnosis present

## 2021-06-13 DIAGNOSIS — Z9049 Acquired absence of other specified parts of digestive tract: Secondary | ICD-10-CM | POA: Diagnosis not present

## 2021-06-13 LAB — CBC
HCT: 42.5 % (ref 39.0–52.0)
Hemoglobin: 14.7 g/dL (ref 13.0–17.0)
MCH: 32.5 pg (ref 26.0–34.0)
MCHC: 34.6 g/dL (ref 30.0–36.0)
MCV: 93.8 fL (ref 80.0–100.0)
Platelets: 184 10*3/uL (ref 150–400)
RBC: 4.53 MIL/uL (ref 4.22–5.81)
RDW: 13.1 % (ref 11.5–15.5)
WBC: 7.3 10*3/uL (ref 4.0–10.5)
nRBC: 0 % (ref 0.0–0.2)

## 2021-06-13 LAB — PROTIME-INR
INR: 1 (ref 0.8–1.2)
Prothrombin Time: 12.6 seconds (ref 11.4–15.2)

## 2021-06-13 LAB — BASIC METABOLIC PANEL
Anion gap: 7 (ref 5–15)
BUN: 12 mg/dL (ref 6–20)
CO2: 27 mmol/L (ref 22–32)
Calcium: 9 mg/dL (ref 8.9–10.3)
Chloride: 105 mmol/L (ref 98–111)
Creatinine, Ser: 1.28 mg/dL — ABNORMAL HIGH (ref 0.61–1.24)
GFR, Estimated: >60 mL/min (ref >60)
Glucose, Bld: 92 mg/dL (ref 70–99)
Potassium: 3.9 mmol/L (ref 3.5–5.1)
Sodium: 139 mmol/L (ref 135–145)

## 2021-06-13 LAB — RESP PANEL BY RT-PCR (FLU A&B, COVID) ARPGX2
Influenza A by PCR: NEGATIVE
Influenza B by PCR: NEGATIVE
SARS Coronavirus 2 by RT PCR: NEGATIVE

## 2021-06-13 LAB — TROPONIN I (HIGH SENSITIVITY)
Troponin I (High Sensitivity): 83 ng/L — ABNORMAL HIGH (ref <18)
Troponin I (High Sensitivity): 92 ng/L — ABNORMAL HIGH (ref <18)

## 2021-06-13 MED ORDER — ASPIRIN 81 MG PO CHEW
324.0000 mg | CHEWABLE_TABLET | Freq: Once | ORAL | Status: AC
  Administered 2021-06-13: 324 mg via ORAL
  Filled 2021-06-13: qty 4

## 2021-06-13 MED ORDER — HEPARIN (PORCINE) 25000 UT/250ML-% IV SOLN
1300.0000 [IU]/h | INTRAVENOUS | Status: DC
  Administered 2021-06-13: 1300 [IU]/h via INTRAVENOUS
  Filled 2021-06-13: qty 250

## 2021-06-13 MED ORDER — NITROGLYCERIN 0.4 MG SL SUBL
0.4000 mg | SUBLINGUAL_TABLET | SUBLINGUAL | Status: AC | PRN
Start: 2021-06-13 — End: 2021-06-14
  Administered 2021-06-13 – 2021-06-14 (×3): 0.4 mg via SUBLINGUAL
  Filled 2021-06-13 (×4): qty 1

## 2021-06-13 MED ORDER — SODIUM CHLORIDE 0.9 % IV BOLUS
1000.0000 mL | Freq: Once | INTRAVENOUS | Status: AC
  Administered 2021-06-13: 1000 mL via INTRAVENOUS

## 2021-06-13 MED ORDER — HEPARIN BOLUS VIA INFUSION
4000.0000 [IU] | Freq: Once | INTRAVENOUS | Status: AC
  Administered 2021-06-13: 4000 [IU] via INTRAVENOUS

## 2021-06-13 NOTE — ED Notes (Signed)
Cardiology paged through [facility] for [facility] by this RN.

## 2021-06-13 NOTE — Progress Notes (Signed)
ANTICOAGULATION CONSULT NOTE - Initial Consult  Pharmacy Consult for Heparin Indication: chest pain/ACS  Allergies  Allergen Reactions   Hydrocodone Hives    Patient Measurements: Height: 5\' 11"  (180.3 cm) Weight: 97.5 kg (215 lb) IBW/kg (Calculated) : 75.3 Heparin Dosing Weight: 95.1 kg  Vital Signs: Temp: 98.5 F (36.9 C) (07/17 1903) Temp Source: Oral (07/17 1903) BP: 139/92 (07/17 1930) Pulse Rate: 59 (07/17 1930)  Labs: Recent Labs    06/13/21 1907  HGB 14.7  HCT 42.5  PLT 184  CREATININE 1.28*  TROPONINIHS 83*    Estimated Creatinine Clearance: 77.7 mL/min (A) (by C-G formula based on SCr of 1.28 mg/dL (H)).   Medical History: Past Medical History:  Diagnosis Date   Diverticulitis    GERD (gastroesophageal reflux disease)    Gout     Medications:  (Not in a hospital admission)  Scheduled:  Infusions:   Assessment: 55 yom presenting with chest pain. Heparin initiated for ACS.  Not on anticoagulation prior to arrival. CBC stable.  Goal of Therapy:  Heparin level 0.3-0.7 units/ml Monitor platelets by anticoagulation protocol: Yes   Plan:  Give 4000 units bolus x 1 Start heparin infusion at 1300 units/hr Check anti-Xa level in 6-8 hours and daily while on heparin Continue to monitor H&H and platelets  06/15/21 06/13/2021,8:24 PM

## 2021-06-13 NOTE — ED Triage Notes (Addendum)
Pt is present for intermittent left sided chest "tightness" that started yesterday that radiates across his chest and down his right arm. Pt states the pain down his right arm is more like "numbness". Pt also c/o nausea and vomiting. One episode of emesis this morning. Denies SOB. Pt states he may have developed chest pain yesterday after being in the heat all day.

## 2021-06-13 NOTE — ED Notes (Signed)
Pt states that his pain is relieved after the second dose of SL Nitro. ED MD notified at this time.

## 2021-06-13 NOTE — ED Provider Notes (Signed)
[facility] EMERGENCY DEPT Provider Note   CSN: [id number] Arrival date & time: 06/13/21  1854     History Chief Complaint  Patient presents with   Chest Pain    Christopher Guerrero is a 55 y.o. male.  HPI 55 year old male presents with chest pain.  It feels like a tightness and pressure.  Started yesterday while at work but he thought this might be heat related.  Went away long enough for him to be able to go to bed last night.  There again this morning.  Has been waxing and waning all day.  When he gets up to walk it seems to be worse.  No dyspnea or back pain.  The pain starts in his left chest and seems to radiate across his chest when he gets bad.  He also has had some right hand tingling intermittently. Took 2 baby aspirin a few hours ago but then vomited.   Past Medical History:  Diagnosis Date   Diverticulitis    GERD (gastroesophageal reflux disease)    Gout     Patient Active Problem List   Diagnosis Date Noted   NSTEMI (non-ST elevated myocardial infarction) (HCC) 06/13/2021   Plantar fasciitis 01/21/2018   Allergic rhinitis, cause unspecified 11/27/2013   Cough 11/27/2013   Cervical lymphadenopathy 11/27/2013   Diverticulitis 12/15/2011   GOUT 08/27/2010   GERD 11/14/2008    Past Surgical History:  Procedure Laterality Date   ABDOMINAL DEBRIDEMENT  2009   APPENDECTOMY  2008   COLECTOMY  2007   sigmoid colon resection and colostomy   COLON SURGERY  2008   colostomy closure   COLON SURGERY  2008   resection of enterocutaneous fistula, small bowel resection, L colon resection with primary anastomosis, abd wall resection       Family History  Problem Relation Age of Onset   Arthritis Other    Hypertension Other    Coronary artery disease Other     Social History   Tobacco Use   Smoking status: Some Days    Packs/day: 20.00    Years: 20.00    Pack years: 400.00    Types: Cigarettes, Cigars   Smokeless tobacco: Never  Substance Use  Topics   Alcohol use: Yes   Drug use: No    Home Medications Prior to Admission medications   Medication Sig Start Date End Date Taking? Authorizing Provider  dexlansoprazole (DEXILANT) 60 MG capsule Take 120 mg by mouth daily.    [provider]  ondansetron (ZOFRAN) 4 MG tablet TK 1 TO 2 TS PO Q 6 TO 8 H PRF NAUSEA 01/02/18   [provider]  pantoprazole (PROTONIX) 40 MG tablet Take 1 tablet by mouth daily. 05/25/17   [provider]    Allergies    Hydrocodone  Review of Systems   Review of Systems  Respiratory:  Negative for shortness of breath.   Cardiovascular:  Positive for chest pain. Negative for leg swelling.  Gastrointestinal:  Positive for vomiting. Negative for abdominal pain.  Musculoskeletal:  Negative for back pain.  All other systems reviewed and are negative.  Physical Exam Updated Vital Signs BP 126/83   Pulse (!) 55   Temp 98.5 F (36.9 C) (Oral)   Resp 17   Ht 5\' 11"  (1.803 m)   Wt 97.5 kg   SpO2 97%   BMI 29.99 kg/m   Physical Exam Vitals and nursing note reviewed.  Constitutional:      Appearance:  He is well-developed.  HENT:     Head: Normocephalic and atraumatic.     Right Ear: External ear normal.     Left Ear: External ear normal.     Nose: Nose normal.  Eyes:     General:        Right eye: No discharge.        Left eye: No discharge.  Cardiovascular:     Rate and Rhythm: Normal rate and regular rhythm.     Pulses:          Radial pulses are 2+ on the right side and 2+ on the left side.     Heart sounds: Normal heart sounds.  Pulmonary:     Effort: Pulmonary effort is normal.     Breath sounds: Normal breath sounds.  Chest:     Chest wall: No tenderness.  Abdominal:     Palpations: Abdomen is soft.     Tenderness: There is no abdominal tenderness.  Musculoskeletal:     Cervical back: Neck supple.     Right lower leg: No edema.     Left lower leg: No edema.  Skin:    General: Skin is warm and dry.   Neurological:     Mental Status: He is alert.     Comments: Equal sensation in bilateral hands.  Psychiatric:        Mood and Affect: Mood is not anxious.    ED Results / Procedures / Treatments   Labs (all labs ordered are listed, but only abnormal results are displayed) Labs Reviewed  BASIC METABOLIC PANEL - Abnormal; Notable for the following components:      Result Value   Creatinine, Ser 1.28 (*)    All other components within normal limits  TROPONIN I (HIGH SENSITIVITY) - Abnormal; Notable for the following components:   Troponin I (High Sensitivity) 83 (*)    All other components within normal limits  TROPONIN I (HIGH SENSITIVITY) - Abnormal; Notable for the following components:   Troponin I (High Sensitivity) 92 (*)    All other components within normal limits  RESP PANEL BY RT-PCR (FLU A&B, COVID) ARPGX2  CBC  PROTIME-INR  HEPARIN LEVEL (UNFRACTIONATED)    EKG EKG Interpretation  Date/Time:  Sunday June 13 2021 20:11:22 EDT Ventricular Rate:  53 PR Interval:  151 QRS Duration: 102 QT Interval:  434 QTC Calculation: 408 R Axis:   28 Text Interpretation: Sinus rhythm no significant change since earlier in the day Confirmed by Pricilla Loveless [id number]) on 06/13/2021 8:19:58 PM  Radiology DG Chest Portable 1 View  Result Date: 06/13/2021 CLINICAL DATA:  Chest pain. EXAM: PORTABLE CHEST 1 VIEW COMPARISON:  October 03, 2016 FINDINGS: The heart size and mediastinal contours are within normal limits. Both lungs are clear. The visualized skeletal structures are unremarkable. IMPRESSION: No active disease. Electronically Signed   By: Gerome Sam III M.D   On: 06/13/2021 20:04    Procedures .Critical Care  Date/Time: 06/13/2021 10:43 PM Performed by: Pricilla Loveless, MD Authorized by: Pricilla Loveless, MD   Critical care provider statement:    Critical care time (minutes):  35   Critical care time was exclusive of:  Separately billable procedures and treating  other patients   Critical care was necessary to treat or prevent imminent or life-threatening deterioration of the following conditions:  Cardiac failure   Critical care was time spent personally by me on the following activities:  Discussions with consultants, evaluation of patient's response to  treatment, examination of patient, ordering and performing treatments and interventions, ordering and review of laboratory studies, ordering and review of radiographic studies, pulse oximetry, re-evaluation of patient's condition, obtaining history from patient or surrogate and review of old charts   Medications Ordered in ED Medications  nitroGLYCERIN (NITROSTAT) SL tablet 0.4 mg (0.4 mg Sublingual Given 06/13/21 2043)  heparin ADULT infusion 100 units/mL (25000 units/252mL) (1,300 Units/hr Intravenous New Bag/Given 06/13/21 2055)  aspirin chewable tablet 324 mg (324 mg Oral Given 06/13/21 1936)  sodium chloride 0.9 % bolus 1,000 mL (0 mLs Intravenous Stopped 06/13/21 2020)  heparin bolus via infusion 4,000 Units (4,000 Units Intravenous Bolus from Bag 06/13/21 2055)    ED Course  I have reviewed the triage vital signs and the nursing notes.  Pertinent labs & imaging results that were available during my care of the patient were reviewed by me and considered in my medical decision making (see chart for details).  Clinical Course as of 06/13/21 2239  Wynelle Link Jun 13, 2021  2029 Cardiology agrees this is likely NSTEMI but would like to draw second troponin now and if this is going up this would clinch this diagnosis and then cardiology will accept.  Otherwise patient will be admitted to medicine. [SG]    Clinical Course User Index [SG] Pricilla Loveless, MD   MDM Rules/Calculators/A&P                          Patient's ECG has no significant ischemic changes.  Repeat ECGs are unchanged.  Troponin is elevated consistent with non-STEMI.  He was given aspirin, nitroglycerin and started on heparin.  The  nitroglycerin has resolved his pain.  Discussed with Dr. Patsi Sears who will admit to the cardiology service. Final Clinical Impression(s) / ED Diagnoses Final diagnoses:  NSTEMI (non-ST elevated myocardial infarction) [facility])    Rx / DC Orders ED Discharge Orders     None        Pricilla Loveless, MD 06/13/21 2243

## 2021-06-13 NOTE — ED Notes (Signed)
[facility] contacted for p/u

## 2021-06-14 ENCOUNTER — Encounter (HOSPITAL_COMMUNITY)
Admission: EM | Disposition: A | Discharge status: Home / Self Care | Payer: Self-pay | Source: Home / Self Care | Attending: Internal Medicine

## 2021-06-14 ENCOUNTER — Encounter (HOSPITAL_COMMUNITY): Payer: Self-pay | Admitting: Internal Medicine

## 2021-06-14 ENCOUNTER — Inpatient Hospital Stay (HOSPITAL_COMMUNITY): Payer: BC Managed Care – PPO

## 2021-06-14 DIAGNOSIS — I214 Non-ST elevation (NSTEMI) myocardial infarction: Secondary | ICD-10-CM

## 2021-06-14 DIAGNOSIS — E785 Hyperlipidemia, unspecified: Secondary | ICD-10-CM | POA: Diagnosis present

## 2021-06-14 DIAGNOSIS — M109 Gout, unspecified: Secondary | ICD-10-CM | POA: Diagnosis present

## 2021-06-14 DIAGNOSIS — Z79899 Other long term (current) drug therapy: Secondary | ICD-10-CM | POA: Diagnosis not present

## 2021-06-14 DIAGNOSIS — I2510 Atherosclerotic heart disease of native coronary artery without angina pectoris: Secondary | ICD-10-CM | POA: Diagnosis present

## 2021-06-14 DIAGNOSIS — I10 Essential (primary) hypertension: Secondary | ICD-10-CM | POA: Diagnosis present

## 2021-06-14 DIAGNOSIS — Z20822 Contact with and (suspected) exposure to covid-19: Secondary | ICD-10-CM | POA: Diagnosis present

## 2021-06-14 DIAGNOSIS — R001 Bradycardia, unspecified: Secondary | ICD-10-CM | POA: Diagnosis present

## 2021-06-14 DIAGNOSIS — F17210 Nicotine dependence, cigarettes, uncomplicated: Secondary | ICD-10-CM | POA: Diagnosis present

## 2021-06-14 DIAGNOSIS — Z8249 Family history of ischemic heart disease and other diseases of the circulatory system: Secondary | ICD-10-CM | POA: Diagnosis not present

## 2021-06-14 DIAGNOSIS — Z9049 Acquired absence of other specified parts of digestive tract: Secondary | ICD-10-CM | POA: Diagnosis not present

## 2021-06-14 DIAGNOSIS — K219 Gastro-esophageal reflux disease without esophagitis: Secondary | ICD-10-CM | POA: Diagnosis present

## 2021-06-14 HISTORY — PX: INTRAVASCULAR ULTRASOUND/IVUS: CATH118244

## 2021-06-14 HISTORY — PX: LEFT HEART CATH AND CORONARY ANGIOGRAPHY: CATH118249

## 2021-06-14 HISTORY — PX: CORONARY STENT INTERVENTION: CATH118234

## 2021-06-14 LAB — LIPID PANEL
Cholesterol: 138 mg/dL (ref 0–200)
HDL: 28 mg/dL — ABNORMAL LOW (ref >40)
LDL Cholesterol: 95 mg/dL (ref 0–99)
Total CHOL/HDL Ratio: 4.9 RATIO
Triglycerides: 75 mg/dL (ref <150)
VLDL: 15 mg/dL (ref 0–40)

## 2021-06-14 LAB — BRAIN NATRIURETIC PEPTIDE: B Natriuretic Peptide: 18.9 pg/mL (ref 0.0–100.0)

## 2021-06-14 LAB — TROPONIN I (HIGH SENSITIVITY)
Troponin I (High Sensitivity): 301 ng/L (ref <18)
Troponin I (High Sensitivity): 365 ng/L (ref <18)

## 2021-06-14 LAB — CBC
HCT: 37.7 % — ABNORMAL LOW (ref 39.0–52.0)
Hemoglobin: 13 g/dL (ref 13.0–17.0)
MCH: 32.7 pg (ref 26.0–34.0)
MCHC: 34.5 g/dL (ref 30.0–36.0)
MCV: 94.7 fL (ref 80.0–100.0)
Platelets: 154 10*3/uL (ref 150–400)
RBC: 3.98 MIL/uL — ABNORMAL LOW (ref 4.22–5.81)
RDW: 13 % (ref 11.5–15.5)
WBC: 6.5 10*3/uL (ref 4.0–10.5)
nRBC: 0 % (ref 0.0–0.2)

## 2021-06-14 LAB — BASIC METABOLIC PANEL
Anion gap: 6 (ref 5–15)
BUN: 11 mg/dL (ref 6–20)
CO2: 26 mmol/L (ref 22–32)
Calcium: 8.7 mg/dL — ABNORMAL LOW (ref 8.9–10.3)
Chloride: 108 mmol/L (ref 98–111)
Creatinine, Ser: 1.16 mg/dL (ref 0.61–1.24)
GFR, Estimated: >60 mL/min (ref >60)
Glucose, Bld: 99 mg/dL (ref 70–99)
Potassium: 4 mmol/L (ref 3.5–5.1)
Sodium: 140 mmol/L (ref 135–145)

## 2021-06-14 LAB — HEMOGLOBIN A1C
Hgb A1c MFr Bld: 5.2 % (ref 4.8–5.6)
Mean Plasma Glucose: 102.54 mg/dL

## 2021-06-14 LAB — ECHOCARDIOGRAM COMPLETE
Area-P 1/2: 2.22 cm2
Height: 71 in
S' Lateral: 3.8 cm
Weight: 3291.2 oz

## 2021-06-14 LAB — PROTIME-INR
INR: 1.1 (ref 0.8–1.2)
Prothrombin Time: 14.2 seconds (ref 11.4–15.2)

## 2021-06-14 LAB — HEPARIN LEVEL (UNFRACTIONATED): Heparin Unfractionated: 0.43 IU/mL (ref 0.30–0.70)

## 2021-06-14 LAB — POCT ACTIVATED CLOTTING TIME: Activated Clotting Time: 335 seconds

## 2021-06-14 LAB — MAGNESIUM: Magnesium: 2.5 mg/dL — ABNORMAL HIGH (ref 1.7–2.4)

## 2021-06-14 SURGERY — LEFT HEART CATH AND CORONARY ANGIOGRAPHY
Anesthesia: LOCAL

## 2021-06-14 MED ORDER — ASPIRIN 81 MG PO CHEW
81.0000 mg | CHEWABLE_TABLET | Freq: Every day | ORAL | Status: DC
  Administered 2021-06-15: 81 mg via ORAL
  Filled 2021-06-14: qty 1

## 2021-06-14 MED ORDER — LIDOCAINE HCL (PF) 1 % IJ SOLN
INTRAMUSCULAR | Status: DC | PRN
  Administered 2021-06-14: 2 mL

## 2021-06-14 MED ORDER — SODIUM CHLORIDE 0.9% FLUSH
3.0000 mL | Freq: Two times a day (BID) | INTRAVENOUS | Status: DC
  Administered 2021-06-14 – 2021-06-15 (×2): 3 mL via INTRAVENOUS

## 2021-06-14 MED ORDER — TICAGRELOR 90 MG PO TABS
ORAL_TABLET | ORAL | Status: DC | PRN
  Administered 2021-06-14: 180 mg via ORAL

## 2021-06-14 MED ORDER — ISOSORBIDE MONONITRATE ER 30 MG PO TB24
30.0000 mg | ORAL_TABLET | Freq: Every day | ORAL | Status: DC
  Administered 2021-06-14: 30 mg via ORAL
  Filled 2021-06-14: qty 1

## 2021-06-14 MED ORDER — MIDAZOLAM HCL 2 MG/2ML IJ SOLN
INTRAMUSCULAR | Status: DC | PRN
  Administered 2021-06-14: 1 mg via INTRAVENOUS
  Administered 2021-06-14: 2 mg via INTRAVENOUS

## 2021-06-14 MED ORDER — HYDRALAZINE HCL 20 MG/ML IJ SOLN: 10.0000 mg | INTRAMUSCULAR | Status: AC | PRN

## 2021-06-14 MED ORDER — MIDAZOLAM HCL 2 MG/2ML IJ SOLN
INTRAMUSCULAR | Status: AC
  Filled 2021-06-14: qty 2

## 2021-06-14 MED ORDER — HEPARIN SODIUM (PORCINE) 1000 UNIT/ML IJ SOLN
INTRAMUSCULAR | Status: DC | PRN
  Administered 2021-06-14: 4500 [IU] via INTRAVENOUS
  Administered 2021-06-14: 6000 [IU] via INTRAVENOUS

## 2021-06-14 MED ORDER — SODIUM CHLORIDE 0.9 % IV SOLN: INTRAVENOUS | Status: DC

## 2021-06-14 MED ORDER — NITROGLYCERIN 0.4 MG SL SUBL: 0.4000 mg | SUBLINGUAL_TABLET | SUBLINGUAL | Status: DC | PRN

## 2021-06-14 MED ORDER — TICAGRELOR 90 MG PO TABS
ORAL_TABLET | ORAL | Status: AC
  Filled 2021-06-14: qty 1

## 2021-06-14 MED ORDER — HEPARIN (PORCINE) IN NACL 1000-0.9 UT/500ML-% IV SOLN
INTRAVENOUS | Status: AC
  Filled 2021-06-14: qty 1000

## 2021-06-14 MED ORDER — IOHEXOL 350 MG/ML SOLN
INTRAVENOUS | Status: DC | PRN
  Administered 2021-06-14: 100 mL via INTRACARDIAC

## 2021-06-14 MED ORDER — ONDANSETRON HCL 4 MG/2ML IJ SOLN: 4.0000 mg | Freq: Four times a day (QID) | INTRAMUSCULAR | Status: DC | PRN

## 2021-06-14 MED ORDER — PERFLUTREN LIPID MICROSPHERE
1.0000 mL | INTRAVENOUS | Status: AC | PRN
  Administered 2021-06-14: 4 mL via INTRAVENOUS
  Filled 2021-06-14: qty 10

## 2021-06-14 MED ORDER — TICAGRELOR 90 MG PO TABS
90.0000 mg | ORAL_TABLET | Freq: Two times a day (BID) | ORAL | Status: DC
  Administered 2021-06-14 – 2021-06-15 (×2): 90 mg via ORAL
  Filled 2021-06-14 (×2): qty 1

## 2021-06-14 MED ORDER — HEPARIN SODIUM (PORCINE) 1000 UNIT/ML IJ SOLN
INTRAMUSCULAR | Status: AC
  Filled 2021-06-14: qty 1

## 2021-06-14 MED ORDER — SODIUM CHLORIDE 0.9 % IV SOLN: 12.5000 mg | Freq: Four times a day (QID) | INTRAVENOUS | Status: DC | PRN

## 2021-06-14 MED ORDER — NITROGLYCERIN 1 MG/10 ML FOR IR/CATH LAB
INTRA_ARTERIAL | Status: DC | PRN
  Administered 2021-06-14: 400 ug via INTRA_ARTERIAL

## 2021-06-14 MED ORDER — ATORVASTATIN CALCIUM 80 MG PO TABS
80.0000 mg | ORAL_TABLET | Freq: Every day | ORAL | Status: DC
  Administered 2021-06-14 – 2021-06-15 (×2): 80 mg via ORAL
  Filled 2021-06-14 (×2): qty 1

## 2021-06-14 MED ORDER — ONDANSETRON HCL 4 MG/2ML IJ SOLN
4.0000 mg | Freq: Four times a day (QID) | INTRAMUSCULAR | Status: DC | PRN
  Administered 2021-06-14: 4 mg via INTRAVENOUS
  Filled 2021-06-14: qty 2

## 2021-06-14 MED ORDER — SODIUM CHLORIDE 0.9% FLUSH
3.0000 mL | Freq: Two times a day (BID) | INTRAVENOUS | Status: DC
  Administered 2021-06-14 (×2): 3 mL via INTRAVENOUS

## 2021-06-14 MED ORDER — ACETAMINOPHEN 325 MG PO TABS
650.0000 mg | ORAL_TABLET | ORAL | Status: DC | PRN
  Administered 2021-06-15: 650 mg via ORAL
  Filled 2021-06-14: qty 2

## 2021-06-14 MED ORDER — VERAPAMIL HCL 2.5 MG/ML IV SOLN
INTRAVENOUS | Status: DC | PRN
Start: 2021-06-14 — End: 2021-06-14
  Administered 2021-06-14 (×2): 200 ug via INTRACORONARY

## 2021-06-14 MED ORDER — ASPIRIN EC 81 MG PO TBEC: 81.0000 mg | DELAYED_RELEASE_TABLET | Freq: Every day | ORAL | Status: DC

## 2021-06-14 MED ORDER — SODIUM CHLORIDE 0.9 % IV SOLN: INTRAVENOUS | Status: AC

## 2021-06-14 MED ORDER — ACETAMINOPHEN 325 MG PO TABS: 650.0000 mg | ORAL_TABLET | ORAL | Status: DC | PRN

## 2021-06-14 MED ORDER — SODIUM CHLORIDE 0.9% FLUSH: 3.0000 mL | INTRAVENOUS | Status: DC | PRN

## 2021-06-14 MED ORDER — HEPARIN (PORCINE) IN NACL 1000-0.9 UT/500ML-% IV SOLN
INTRAVENOUS | Status: DC | PRN
  Administered 2021-06-14 (×2): 500 mL

## 2021-06-14 MED ORDER — SODIUM CHLORIDE 0.9 % IV SOLN
12.5000 mg | Freq: Four times a day (QID) | INTRAVENOUS | Status: DC | PRN
  Administered 2021-06-14: 12.5 mg via INTRAVENOUS
  Filled 2021-06-14 (×2): qty 0.5

## 2021-06-14 MED ORDER — METOPROLOL TARTRATE 25 MG PO TABS: 25.0000 mg | ORAL_TABLET | Freq: Two times a day (BID) | ORAL | Status: DC

## 2021-06-14 MED ORDER — SODIUM CHLORIDE 0.9 % IV SOLN: 250.0000 mL | INTRAVENOUS | Status: DC | PRN

## 2021-06-14 MED ORDER — PROMETHAZINE HCL 25 MG PO TABS
12.5000 mg | ORAL_TABLET | Freq: Four times a day (QID) | ORAL | Status: DC | PRN
  Administered 2021-06-14 – 2021-06-15 (×2): 12.5 mg via ORAL
  Filled 2021-06-14 (×2): qty 1

## 2021-06-14 MED ORDER — SODIUM CHLORIDE 0.9% FLUSH
3.0000 mL | INTRAVENOUS | Status: DC | PRN
Start: 2021-06-14 — End: 2021-06-14

## 2021-06-14 MED ORDER — SODIUM CHLORIDE 0.9 % IV SOLN
250.0000 mL | INTRAVENOUS | Status: DC | PRN
Start: 2021-06-14 — End: 2021-06-14

## 2021-06-14 MED ORDER — FENTANYL CITRATE (PF) 100 MCG/2ML IJ SOLN
INTRAMUSCULAR | Status: AC
  Filled 2021-06-14: qty 2

## 2021-06-14 MED ORDER — LIDOCAINE HCL (PF) 1 % IJ SOLN
INTRAMUSCULAR | Status: AC
  Filled 2021-06-14: qty 30

## 2021-06-14 MED ORDER — PANTOPRAZOLE SODIUM 40 MG PO TBEC
40.0000 mg | DELAYED_RELEASE_TABLET | Freq: Every day | ORAL | Status: DC
  Administered 2021-06-15: 40 mg via ORAL
  Filled 2021-06-14: qty 1

## 2021-06-14 MED ORDER — VERAPAMIL HCL 2.5 MG/ML IV SOLN
INTRAVENOUS | Status: DC | PRN
  Administered 2021-06-14: 10 mL via INTRA_ARTERIAL
  Administered 2021-06-14: 5 mL via INTRA_ARTERIAL

## 2021-06-14 MED ORDER — LABETALOL HCL 5 MG/ML IV SOLN: 10.0000 mg | INTRAVENOUS | Status: AC | PRN

## 2021-06-14 MED ORDER — VERAPAMIL HCL 2.5 MG/ML IV SOLN
INTRAVENOUS | Status: AC
  Filled 2021-06-14: qty 2

## 2021-06-14 MED ORDER — NITROGLYCERIN 1 MG/10 ML FOR IR/CATH LAB
INTRA_ARTERIAL | Status: AC
  Filled 2021-06-14: qty 10

## 2021-06-14 MED ORDER — FENTANYL CITRATE (PF) 100 MCG/2ML IJ SOLN
INTRAMUSCULAR | Status: DC | PRN
  Administered 2021-06-14: 25 ug via INTRAVENOUS
  Administered 2021-06-14: 50 ug via INTRAVENOUS
  Administered 2021-06-14: 25 ug via INTRAVENOUS

## 2021-06-14 MED ORDER — ASPIRIN 81 MG PO CHEW
81.0000 mg | CHEWABLE_TABLET | ORAL | Status: AC
  Administered 2021-06-14: 81 mg via ORAL
  Filled 2021-06-14: qty 1

## 2021-06-14 SURGICAL SUPPLY — 17 items
BALLN SAPPHIRE 3.0X15 (BALLOONS) ×2
BALLOON SAPPHIRE 3.0X15 (BALLOONS) IMPLANT
CATH 5FR JL3.5 JR4 ANG PIG MP (CATHETERS) ×1 IMPLANT
CATH LAUNCHER 6FR JR4 (CATHETERS) ×1 IMPLANT
CATH OPTICROSS HD (CATHETERS) ×1 IMPLANT
DEVICE RAD COMP TR BAND LRG (VASCULAR PRODUCTS) ×1 IMPLANT
GLIDESHEATH SLEND SS 6F .021 (SHEATH) ×1 IMPLANT
GUIDEWIRE INQWIRE 1.5J.035X260 (WIRE) IMPLANT
INQWIRE 1.5J .035X260CM (WIRE) ×2
KIT ENCORE 26 ADVANTAGE (KITS) ×1 IMPLANT
KIT HEART LEFT (KITS) ×2 IMPLANT
PACK CARDIAC CATHETERIZATION (CUSTOM PROCEDURE TRAY) ×2 IMPLANT
SLED PULL BACK IVUS (MISCELLANEOUS) ×1 IMPLANT
STENT ONYX FRONTIER 4.5X22 (Permanent Stent) ×1 IMPLANT
TRANSDUCER W/STOPCOCK (MISCELLANEOUS) ×2 IMPLANT
TUBING CIL FLEX 10 FLL-RA (TUBING) ×2 IMPLANT
WIRE ASAHI PROWATER 180CM (WIRE) ×1 IMPLANT

## 2021-06-14 NOTE — Interval H&P Note (Signed)
Cath Lab Visit (complete for each Cath Lab visit)  Clinical Evaluation Leading to the Procedure:   ACS: Yes.    Non-ACS:    Anginal Classification: CCS IV  Anti-ischemic medical therapy: Minimal Therapy (1 class of medications)  Non-Invasive Test Results: No non-invasive testing performed  Prior CABG: No previous CABG   NSTEMI   History and Physical Interval Note:  06/14/2021 10:57 AM  Christopher Guerrero  has presented today for surgery, with the diagnosis of nstemi.  The various methods of treatment have been discussed with the patient and family. After consideration of risks, benefits and other options for treatment, the patient has consented to  Procedure(s): LEFT HEART CATH AND CORONARY ANGIOGRAPHY (N/A) as a surgical intervention.  The patient's history has been reviewed, patient examined, no change in status, stable for surgery.  I have reviewed the patient's chart and labs.  Questions were answered to the patient's satisfaction.     Lance Muss

## 2021-06-14 NOTE — Progress Notes (Signed)
ANTICOAGULATION CONSULT NOTE   Pharmacy Consult for Heparin Indication: chest pain/ACS  Allergies  Allergen Reactions   Hydrocodone Hives    Patient Measurements: Height: 5\' 11"  (180.3 cm) Weight: 97.5 kg (215 lb) IBW/kg (Calculated) : 75.3 Heparin Dosing Weight: 95.1 kg  Vital Signs: Temp: 98.5 F (36.9 C) (07/17 1903) Temp Source: Oral (07/17 1903) BP: 130/95 (07/17 2345) Pulse Rate: 55 (07/17 2345)  Labs: Recent Labs    06/13/21 1907 06/13/21 2026 06/13/21 2039 06/14/21 0215  HGB 14.7  --   --  13.0  HCT 42.5  --   --  37.7*  PLT 184  --   --  154  LABPROT  --   --  12.6 14.2  INR  --   --  1.0 1.1  HEPARINUNFRC  --   --   --  0.43  CREATININE 1.28*  --   --  1.16  TROPONINIHS 83* 92*  --   --      Estimated Creatinine Clearance: 85.7 mL/min (by C-G formula based on SCr of 1.16 mg/dL).   Medical History: Past Medical History:  Diagnosis Date   Diverticulitis    GERD (gastroesophageal reflux disease)    Gout     Medications:  Medications Prior to Admission  Medication Sig Dispense Refill Last Dose   dexlansoprazole (DEXILANT) 60 MG capsule Take 120 mg by mouth daily.      ondansetron (ZOFRAN) 4 MG tablet TK 1 TO 2 TS PO Q 6 TO 8 H PRF NAUSEA  0    pantoprazole (PROTONIX) 40 MG tablet Take 1 tablet by mouth daily.  12    Scheduled:   [START ON 06/15/2021] aspirin EC  81 mg Oral Daily   atorvastatin  80 mg Oral Daily   isosorbide mononitrate  30 mg Oral Daily   Infusions:   heparin 1,300 Units/hr (06/13/21 2055)    Assessment: 55 yom presenting with chest pain. Heparin initiated for ACS.  Not on anticoagulation prior to arrival. CBC stable.  7/18 AM update:  Heparin level therapeutic   Goal of Therapy:  Heparin level 0.3-0.7 units/ml Monitor platelets by anticoagulation protocol: Yes   Plan:  Cont heparin 1300 units/hr Confirmatory heparin level in 8 hours  8/18, PharmD, BCPS Clinical Pharmacist Phone: [phone]

## 2021-06-14 NOTE — Progress Notes (Signed)
  Echocardiogram 2D Echocardiogram has been performed.  Christopher Guerrero 06/14/2021, 2:39 PM

## 2021-06-14 NOTE — H&P (View-Only) (Signed)
 Progress Note  Patient Name: Christopher Guerrero Date of Encounter: 06/14/2021  [facility] Cardiologist: Peter Nishan, MD   Subjective   Feels pretty well, some pinching pain earlier but no significant pain. No SOB  Inpatient Medications    Scheduled Meds:  [START ON 06/15/2021] aspirin EC  81 mg Oral Daily   atorvastatin  80 mg Oral Daily   isosorbide mononitrate  30 mg Oral Daily   Continuous Infusions:  heparin 1,300 Units/hr (06/14/21 0545)   PRN Meds: acetaminophen, nitroGLYCERIN, ondansetron (ZOFRAN) IV   Vital Signs    Vitals:   06/13/21 2130 06/13/21 2345 06/14/21 0127 06/14/21 0500  BP: 126/83 (!) 130/95 (!) 141/93 (!) 144/87  Pulse: (!) 55 (!) 55 (!) 56   Resp: 17 15 15 18  Temp:   98 F (36.7 C) 98 F (36.7 C)  TempSrc:   Oral Oral  SpO2: 97% 100% 100%   Weight:    93.3 kg  Height:        Intake/Output Summary (Last 24 hours) at 06/14/2021 0741 Last data filed at 06/14/2021 0545 Gross per 24 hour  Intake 1152.55 ml  Output --  Net 1152.55 ml   Last 3 Weights 06/14/2021 06/13/2021 06/30/2017  Weight (lbs) 205 lb 11.2 oz 215 lb 216 lb  Weight (kg) 93.305 kg 97.523 kg 97.977 kg      Telemetry    SR - Personally Reviewed  ECG    No new - Personally Reviewed  Physical Exam   GEN: No acute distress.   Neck: No JVD Cardiac: RRR, no murmurs, rubs, or gallops.  Respiratory: Clear to auscultation bilaterally. GI: Soft, nontender, non-distended  MS: No edema; No deformity. Neuro:  Nonfocal  Psych: Normal affect   Labs    High Sensitivity Troponin:   Recent Labs  Lab 06/13/21 1907 06/13/21 2026 06/14/21 0215  TROPONINIHS 83* 92* 301*      Chemistry Recent Labs  Lab 06/13/21 1907 06/14/21 0215  NA 139 140  K 3.9 4.0  CL 105 108  CO2 27 26  GLUCOSE 92 99  BUN 12 11  CREATININE 1.28* 1.16  CALCIUM 9.0 8.7*  GFRNONAA >60 >60  ANIONGAP 7 6     Hematology Recent Labs  Lab 06/13/21 1907 06/14/21 0215  WBC 7.3 6.5  RBC 4.53  3.98*  HGB 14.7 13.0  HCT 42.5 37.7*  MCV 93.8 94.7  MCH 32.5 32.7  MCHC 34.6 34.5  RDW 13.1 13.0  PLT 184 154    BNP Recent Labs  Lab 06/14/21 0215  BNP 18.9     DDimer No results for input(s): DDIMER in the last 168 hours.   Radiology    DG Chest Portable 1 View  Result Date: 06/13/2021 CLINICAL DATA:  Chest pain. EXAM: PORTABLE CHEST 1 VIEW COMPARISON:  October 03, 2016 FINDINGS: The heart size and mediastinal contours are within normal limits. Both lungs are clear. The visualized skeletal structures are unremarkable. IMPRESSION: No active disease. Electronically Signed   By: David  Williams III M.D   On: 06/13/2021 20:04    Cardiac Studies   Echo pending  Patient Profile     55 y.o. male with borderline hypertension, remote 20 pack year smoking history, early family history of CAD, and BMI of 30 now admitted with NSTEMI.   Assessment & Plan    NSTEMI pk troponin 82, 93 now 301 - on ASA, statin, IV heparin, no BB due to Bradycardia, placed on imdur 30 , Echo   pending -plan for cardiac cath today  -The patient understands that risks included but are not limited to stroke (1 in 1000), death (1 in 1000), kidney failure [usually temporary] (1 in 500), bleeding (1 in 200), allergic reaction [possibly serious] (1 in 200). Pt agreeable to proceed.  -pt planning vacation to [place] next week.  - he workd in heating and air.    HTN on arrival but no hx.    HLD HDL 28 and LDL 95   COVID neg   For questions or updates, please contact [facility] Please consult [email or web address] for contact info under        Signed, Nada Boozer, NP  06/14/2021, 7:41 AM    Patient seen and examined with Nada Boozer NP.  Agree as above, with the following exceptions and changes as noted below. Patient with mild residual chest tightness but otherwise feeling well. Presented with significant left sided chest pain. On IV heparin. Gen: NAD, CV: RRR, no murmurs, Lungs: clear, Abd: soft, Extrem:  Warm, well perfused, no edema, Neuro/Psych: alert and oriented x 3, normal mood and affect. All available labs, radiology testing, previous records reviewed. He is agreeable to [facility] today for NSTEMI with elevated troponin and cardiac chest pain. Consent documented above.   Agree with above medical therapy.    Parke Poisson, MD 06/14/21 9:50 AM

## 2021-06-14 NOTE — Progress Notes (Addendum)
Progress Note  Patient Name: Christopher Guerrero Date of Encounter: 06/14/2021  [facility] Cardiologist: Charlton Haws, MD   Subjective   Feels pretty well, some pinching pain earlier but no significant pain. No SOB  Inpatient Medications    Scheduled Meds:  [START ON 06/15/2021] aspirin EC  81 mg Oral Daily   atorvastatin  80 mg Oral Daily   isosorbide mononitrate  30 mg Oral Daily   Continuous Infusions:  heparin 1,300 Units/hr (06/14/21 0545)   PRN Meds: acetaminophen, nitroGLYCERIN, ondansetron (ZOFRAN) IV   Vital Signs    Vitals:   06/13/21 2130 06/13/21 2345 06/14/21 0127 06/14/21 0500  BP: 126/83 (!) 130/95 (!) 141/93 (!) 144/87  Pulse: (!) 55 (!) 55 (!) 56   Resp: 17 15 15 18   Temp:   98 F (36.7 C) 98 F (36.7 C)  TempSrc:   Oral Oral  SpO2: 97% 100% 100%   Weight:    93.3 kg  Height:        Intake/Output Summary (Last 24 hours) at 06/14/2021 0741 Last data filed at 06/14/2021 0545 Gross per 24 hour  Intake 1152.55 ml  Output --  Net 1152.55 ml   Last 3 Weights 06/14/2021 06/13/2021 06/30/2017  Weight (lbs) 205 lb 11.2 oz 215 lb 216 lb  Weight (kg) 93.305 kg 97.523 kg 97.977 kg      Telemetry    SR - Personally Reviewed  ECG    No new - Personally Reviewed  Physical Exam   GEN: No acute distress.   Neck: No JVD Cardiac: RRR, no murmurs, rubs, or gallops.  Respiratory: Clear to auscultation bilaterally. GI: Soft, nontender, non-distended  MS: No edema; No deformity. Neuro:  Nonfocal  Psych: Normal affect   Labs    High Sensitivity Troponin:   Recent Labs  Lab 06/13/21 1907 06/13/21 2026 06/14/21 0215  TROPONINIHS 83* 92* 301*      Chemistry Recent Labs  Lab 06/13/21 1907 06/14/21 0215  NA 139 140  K 3.9 4.0  CL 105 108  CO2 27 26  GLUCOSE 92 99  BUN 12 11  CREATININE 1.28* 1.16  CALCIUM 9.0 8.7*  GFRNONAA >60 >60  ANIONGAP 7 6     Hematology Recent Labs  Lab 06/13/21 1907 06/14/21 0215  WBC 7.3 6.5  RBC 4.53  3.98*  HGB 14.7 13.0  HCT 42.5 37.7*  MCV 93.8 94.7  MCH 32.5 32.7  MCHC 34.6 34.5  RDW 13.1 13.0  PLT 184 154    BNP Recent Labs  Lab 06/14/21 0215  BNP 18.9     DDimer No results for input(s): DDIMER in the last 168 hours.   Radiology    DG Chest Portable 1 View  Result Date: 06/13/2021 CLINICAL DATA:  Chest pain. EXAM: PORTABLE CHEST 1 VIEW COMPARISON:  October 03, 2016 FINDINGS: The heart size and mediastinal contours are within normal limits. Both lungs are clear. The visualized skeletal structures are unremarkable. IMPRESSION: No active disease. Electronically Signed   By: October 05, 2016 III M.D   On: 06/13/2021 20:04    Cardiac Studies   Echo pending  Patient Profile     55 y.o. male with borderline hypertension, remote 20 pack year smoking history, early family history of CAD, and BMI of 30 now admitted with NSTEMI.   Assessment & Plan    NSTEMI pk troponin 82, 93 now 301 - on ASA, statin, IV heparin, no BB due to Bradycardia, placed on imdur 30 , Echo  pending -plan for cardiac cath today  -The patient understands that risks included but are not limited to stroke (1 in 1000), death (1 in 1000), kidney failure [usually temporary] (1 in 500), bleeding (1 in 200), allergic reaction [possibly serious] (1 in 200). Pt agreeable to proceed.  -pt planning vacation to [place] next week.  - he workd in heating and air.    HTN on arrival but no hx.    HLD HDL 28 and LDL 95   COVID neg   For questions or updates, please contact [facility] Please consult [email or web address] for contact info under        Signed, Nada Boozer, NP  06/14/2021, 7:41 AM    Patient seen and examined with Nada Boozer NP.  Agree as above, with the following exceptions and changes as noted below. Patient with mild residual chest tightness but otherwise feeling well. Presented with significant left sided chest pain. On IV heparin. Gen: NAD, CV: RRR, no murmurs, Lungs: clear, Abd: soft, Extrem:  Warm, well perfused, no edema, Neuro/Psych: alert and oriented x 3, normal mood and affect. All available labs, radiology testing, previous records reviewed. He is agreeable to [facility] today for NSTEMI with elevated troponin and cardiac chest pain. Consent documented above.   Agree with above medical therapy.    Parke Poisson, MD 06/14/21 9:50 AM

## 2021-06-14 NOTE — Progress Notes (Signed)
Critical lab value: Troponin = 301.  Cardiologist on call informed

## 2021-06-14 NOTE — H&P (Addendum)
Cardiology Admission History and Physical:   Patient ID: Christopher Guerrero MRN: [id number]; DOB: [birth year]   Admission date: 06/13/2021  PCP:  Christopher Moccasin, MD   [facility] Providers Cardiologist:  Charlton Haws, MD        Chief Complaint:  chest pain  Patient Profile:   Christopher Guerrero is a 55 y.o. male with borderline hypertension, remote 20 pack year smoking history, early family history of CAD, and BMI of 30 who is being seen 06/14/2021 for the evaluation of NSTEMI  History of Present Illness:   Christopher Guerrero presented to [facility] 06/13/2021 after one day of left-sided chest tightness. 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 Accompanied by right hand numbness, malaise, waxing/waning symptoms. Recevied two NTGs at [facility] with resolution of pain; following transfer here symptoms recurred and received one more NTG. Recevied aspirin 325, heparin bolus/gtt. Initial Bps in 180s (never been close to this high before per patient) before dropping to 140s-150s after receiving sl NTG.EKG sinus bradycardia, no ST-T changes. Troponin 83 -> 92.   Past Medical History:  Diagnosis Date   Diverticulitis    GERD (gastroesophageal reflux disease)    Gout     Past Surgical History:  Procedure Laterality Date   ABDOMINAL DEBRIDEMENT  2009   APPENDECTOMY  2008   COLECTOMY  2007   sigmoid colon resection and colostomy   COLON SURGERY  2008   colostomy closure   COLON SURGERY  2008   resection of enterocutaneous fistula, small bowel resection, L colon resection with primary anastomosis, abd wall resection     Medications Prior to Admission: Prior to Admission medications   Medication Sig Start Date End Date Taking? Authorizing Provider  dexlansoprazole (DEXILANT) 60 MG capsule Take 120 mg by mouth daily.    [provider]  ondansetron (ZOFRAN) 4 MG tablet TK 1 TO 2 TS PO Q 6 TO 8 H PRF NAUSEA 01/02/18   [provider]   pantoprazole (PROTONIX) 40 MG tablet Take 1 tablet by mouth daily. 05/25/17   [provider]     Allergies:    Allergies  Allergen Reactions   Hydrocodone Hives    Social History:   Social History   Socioeconomic History   Marital status: Married    Spouse name: Not on file   Number of children: Not on file   Years of education: Not on file   Highest education level: Not on file  Occupational History   Not on file  Tobacco Use   Smoking status: Some Days    Packs/day: 20.00    Years: 20.00    Pack years: 400.00    Types: Cigarettes, Cigars   Smokeless tobacco: Never  Substance and Sexual Activity   Alcohol use: Yes   Drug use: No   Sexual activity: Yes  Other Topics Concern   Not on file  Social History Narrative   Not on file   Social Determinants of Health   Financial Resource Strain: Not on file  Food Insecurity: Not on file  Transportation Needs: Not on file  Physical Activity: Not on file  Stress: Not on file  Social Connections: Not on file  Intimate Partner Violence: Not on file    Family History:  father had MI aged 74 The patient's family history includes Arthritis in an other family member; Coronary artery disease in an other family member; Hypertension in an other family member.  ROS:  Please see the history of present illness.  All other ROS reviewed and negative.     Physical Exam/Data:   Vitals:   06/13/21 2030 06/13/21 2100 06/13/21 2130 06/13/21 2345  BP: (!) 144/99 134/88 126/83 (!) 130/95  Pulse: 64 (!) 59 (!) 55 (!) 55  Resp: 19 15 17 15   Temp:      TempSrc:      SpO2: 97% 98% 97% 100%  Weight:      Height:        Intake/Output Summary (Last 24 hours) at 06/14/2021 0315 Last data filed at 06/13/2021 2020 Gross per 24 hour  Intake 999 ml  Output --  Net 999 ml   Last 3 Weights 06/13/2021 06/30/2017 10/07/2016  Weight (lbs) 215 lb 216 lb 213 lb 12.8 oz  Weight (kg) 97.523 kg 97.977 kg 96.979 kg     Body mass index  is 29.99 kg/m.  General:  Well nourished, well developed, in no acute distress HEENT: normal Lymph: no adenopathy Neck: no JVD Endocrine:  No thryomegaly Vascular: No carotid bruits; FA pulses 2+ bilaterally without bruits  Cardiac:  normal S1, S2; RRR; no murmur  Lungs:  clear to auscultation bilaterally, no wheezing, rhonchi or rales  Abd: soft, nontender, no hepatomegaly  Ext: no edema Musculoskeletal:  No deformities, BUE and BLE strength normal and equal Skin: warm and dry  Neuro:  CNs 2-12 intact, no focal abnormalities noted Psych:  Normal affect    EKG:  The ECG that was done 06/13/2021 was personally reviewed and demonstrates sinus bradycardia  Relevant CV Studies: none  Laboratory Data:  High Sensitivity Troponin:   Recent Labs  Lab 06/13/21 1907 06/13/21 2026  TROPONINIHS 83* 92*      Chemistry Recent Labs  Lab 06/13/21 1907  NA 139  K 3.9  CL 105  CO2 27  GLUCOSE 92  BUN 12  CREATININE 1.28*  CALCIUM 9.0  GFRNONAA >60  ANIONGAP 7    No results for input(s): PROT, ALBUMIN, AST, ALT, ALKPHOS, BILITOT in the last 168 hours. Hematology Recent Labs  Lab 06/13/21 1907 06/14/21 0215  WBC 7.3 6.5  RBC 4.53 3.98*  HGB 14.7 13.0  HCT 42.5 37.7*  MCV 93.8 94.7  MCH 32.5 32.7  MCHC 34.6 34.5  RDW 13.1 13.0  PLT 184 154   BNPNo results for input(s): BNP, PROBNP in the last 168 hours.  DDimer No results for input(s): DDIMER in the last 168 hours.   Radiology/Studies:  DG Chest Portable 1 View  Result Date: 06/13/2021 CLINICAL DATA:  Chest pain. EXAM: PORTABLE CHEST 1 VIEW COMPARISON:  October 03, 2016 FINDINGS: The heart size and mediastinal contours are within normal limits. Both lungs are clear. The visualized skeletal structures are unremarkable. IMPRESSION: No active disease. Electronically Signed   By: October 05, 2016 III M.D   On: 06/13/2021 20:04     Assessment and Plan:   NSTEMI T1: Although troponin trend of 82 -> 93 is not  overwhelming, the patient has a very typical story and several risk factors. I think his hypertension was related to pain/anxiety and not the cause of the symptoms - aspirin 81 daily - start lipitor 80 daily - heparin gtt - no BB given bradycardia - imdur 30 daily - TTE - risk stratification labs - LHC tomorrow  2. BP: see above. If not adequately controlled amlodipine and ACEi/ARB would be reasonable options   Risk Assessment/Risk Scores:    TIMI Risk Score for  Unstable Angina or Non-ST Elevation MI:   The patient's TIMI risk score is  , which indicates a  % risk of all cause mortality, new or recurrent myocardial infarction or need for urgent revascularization in the next 14 days.       Severity of Illness: The appropriate patient status for this patient is INPATIENT. Inpatient status is judged to be reasonable and necessary in order to provide the required intensity of service to ensure the patient's safety. The patient's presenting symptoms, physical exam findings, and initial radiographic and laboratory data in the context of their chronic comorbidities is felt to place them at high risk for further clinical deterioration. Furthermore, it is not anticipated that the patient will be medically stable for discharge from the hospital within 2 midnights of admission. The following factors support the patient status of inpatient.   " The patient's presenting symptoms include chest pain. " The worrisome physical exam findings include typical angina. " The initial radiographic and laboratory data are worrisome because of elevated troponin " The chronic co-morbidities include family history, prior smoking, BP, weight   * I certify that at the point of admission it is my clinical judgment that the patient will require inpatient hospital care spanning beyond 2 midnights from the point of admission due to high intensity of service, high risk for further deterioration and high frequency of  surveillance required.*   For questions or updates, please contact [facility] Please consult [email or web address] for contact info under     Signed, [place] Tannenbaum, MD  06/14/2021 3:15 AM    Patient seen and examined with Dr. Patsi Sears, Cardiology Fellow.  Agree as above, with the following exceptions and changes as noted below. Patient with mild residual chest tightness but otherwise feeling well. Presented with significant left sided chest pain. On IV heparin. Gen: NAD, CV: RRR, no murmurs, Lungs: clear, Abd: soft, Extrem: Warm, well perfused, no edema, Neuro/Psych: alert and oriented x 3, normal mood and affect. All available labs, radiology testing, previous records reviewed. He is agreeable to [facility] today for NSTEMI with elevated troponin and cardiac chest pain. Consent documented in today's progress note from L. Ingold NP, confirmed with patient at bedside.   Agree with current medical therapy, will adjust after cath and echo as needed.  Parke Poisson, MD 06/14/21 9:42 AM

## 2021-06-15 ENCOUNTER — Other Ambulatory Visit (HOSPITAL_COMMUNITY): Payer: Self-pay

## 2021-06-15 ENCOUNTER — Other Ambulatory Visit: Payer: Self-pay

## 2021-06-15 ENCOUNTER — Encounter (HOSPITAL_COMMUNITY): Payer: Self-pay | Admitting: Internal Medicine

## 2021-06-15 DIAGNOSIS — I2510 Atherosclerotic heart disease of native coronary artery without angina pectoris: Secondary | ICD-10-CM

## 2021-06-15 DIAGNOSIS — Z95820 Peripheral vascular angioplasty status with implants and grafts: Secondary | ICD-10-CM

## 2021-06-15 DIAGNOSIS — E785 Hyperlipidemia, unspecified: Secondary | ICD-10-CM | POA: Diagnosis present

## 2021-06-15 HISTORY — DX: Atherosclerotic heart disease of native coronary artery without angina pectoris: I25.10

## 2021-06-15 HISTORY — DX: Peripheral vascular angioplasty status with implants and grafts: Z95.820

## 2021-06-15 HISTORY — DX: Hyperlipidemia, unspecified: E78.5

## 2021-06-15 LAB — BASIC METABOLIC PANEL
Anion gap: 5 (ref 5–15)
BUN: 10 mg/dL (ref 6–20)
CO2: 26 mmol/L (ref 22–32)
Calcium: 9.1 mg/dL (ref 8.9–10.3)
Chloride: 107 mmol/L (ref 98–111)
Creatinine, Ser: 1.25 mg/dL — ABNORMAL HIGH (ref 0.61–1.24)
GFR, Estimated: >60 mL/min (ref >60)
Glucose, Bld: 95 mg/dL (ref 70–99)
Potassium: 3.6 mmol/L (ref 3.5–5.1)
Sodium: 138 mmol/L (ref 135–145)

## 2021-06-15 LAB — CBC
HCT: 37.7 % — ABNORMAL LOW (ref 39.0–52.0)
Hemoglobin: 13.1 g/dL (ref 13.0–17.0)
MCH: 32.9 pg (ref 26.0–34.0)
MCHC: 34.7 g/dL (ref 30.0–36.0)
MCV: 94.7 fL (ref 80.0–100.0)
Platelets: 154 10*3/uL (ref 150–400)
RBC: 3.98 MIL/uL — ABNORMAL LOW (ref 4.22–5.81)
RDW: 13.1 % (ref 11.5–15.5)
WBC: 9.5 10*3/uL (ref 4.0–10.5)
nRBC: 0 % (ref 0.0–0.2)

## 2021-06-15 MED ORDER — LOSARTAN POTASSIUM 25 MG PO TABS
25.0000 mg | ORAL_TABLET | Freq: Every day | ORAL | Status: DC
  Administered 2021-06-15: 25 mg via ORAL
  Filled 2021-06-15: qty 1

## 2021-06-15 MED ORDER — NITROGLYCERIN 0.4 MG SL SUBL
0.4000 mg | SUBLINGUAL_TABLET | SUBLINGUAL | 4 refills | Status: DC | PRN
  Filled 2021-06-15: qty 25, 8d supply, fill #0

## 2021-06-15 MED ORDER — CALCIUM CARBONATE ANTACID 500 MG PO CHEW
2.0000 | CHEWABLE_TABLET | Freq: Once | ORAL | Status: AC
  Administered 2021-06-15: 400 mg via ORAL
  Filled 2021-06-15: qty 2

## 2021-06-15 MED ORDER — LOSARTAN POTASSIUM 25 MG PO TABS
25.0000 mg | ORAL_TABLET | Freq: Every day | ORAL | 6 refills | Status: DC
Start: 2021-06-15 — End: 2021-09-23
  Filled 2021-06-15: qty 30, 30d supply, fill #0

## 2021-06-15 MED ORDER — ASPIRIN 81 MG PO CHEW: 81.0000 mg | CHEWABLE_TABLET | Freq: Every day | ORAL | Status: AC

## 2021-06-15 MED ORDER — ACETAMINOPHEN 325 MG PO TABS: 650.0000 mg | ORAL_TABLET | ORAL | Status: AC | PRN

## 2021-06-15 MED ORDER — TICAGRELOR 90 MG PO TABS
90.0000 mg | ORAL_TABLET | Freq: Two times a day (BID) | ORAL | 11 refills | Status: DC
  Filled 2021-06-15: qty 60, 30d supply, fill #0

## 2021-06-15 MED ORDER — ATORVASTATIN CALCIUM 80 MG PO TABS
80.0000 mg | ORAL_TABLET | Freq: Every day | ORAL | 6 refills | Status: DC
  Filled 2021-06-15: qty 30, 30d supply, fill #0

## 2021-06-15 NOTE — Discharge Summary (Addendum)
Discharge Summary    Patient ID: Christopher Guerrero MRN: [id number]; DOB: 1966/06/05  Admit date: 06/13/2021 Discharge date: 06/15/2021  PCP:  Lewis Moccasin, MD   [facility] Providers Cardiologist:  Charlton Haws, MD        Discharge Diagnoses    Principal Problem:   NSTEMI (non-ST elevated myocardial infarction) [facility]) Active Problems:   GERD   HTN (hypertension)   CAD in native artery   S/P angioplasty with stent 06/14/21 DES to RCA   HLD (hyperlipidemia)    Diagnostic Studies/Procedures    Cardiac cath 06/14/21     Mid RCA lesion is 95% stenosed.   A drug-eluting stent was successfully placed using a STENT ONYX FRONTIER 4.5X22.  Stent was optimized with intravascular ultrasound.   Post intervention, there is a 0% residual stenosis.   Dist RCA lesion is 25% stenosed.   Mid LAD lesion is 25% stenosed.   Mid Cx lesion is 40% stenosed.  Ectasia noted in the mid vessel which causes some swirling of contrast.   The left ventricular systolic function is normal.   LV end diastolic pressure is normal.   The left ventricular ejection fraction is 50-55% by visual estimate.   There is no aortic valve stenosis.   Continue aspirin and Brilinta for 12 months along with aggressive secondary prevention.     I discussed the results with his wife.  She asked about their upcoming trip to [place].  If his wrist heals well and he does well walking tomorrow, I suspect he will be ok to travel.  I stressed the importance of DAPT to prevent stent thrombosis.   Diagnostic Dominance: Right      Intervention      Echo 06/14/21 IMPRESSIONS     1. Left ventricular ejection fraction, by estimation, is 45 to 50%. The  left ventricle has mildly decreased function. The left ventricle  demonstrates global hypokinesis. There is mild left ventricular  hypertrophy. Left ventricular diastolic parameters  are consistent with Grade I diastolic dysfunction (impaired relaxation).   2. Right  ventricular systolic function is normal. The right ventricular  size is normal.   3. The mitral valve is normal in structure. No evidence of mitral valve  regurgitation. No evidence of mitral stenosis.   4. The aortic valve is normal in structure. Aortic valve regurgitation is  not visualized. No aortic stenosis is present.   5. Aortic dilatation noted. There is mild dilatation of the ascending  aorta, measuring 41 mm.   6. The inferior vena cava is normal in size with greater than 50%  respiratory variability, suggesting right atrial pressure of 3 mmHg.   FINDINGS   Left Ventricle: Left ventricular ejection fraction, by estimation, is 45  to 50%. The left ventricle has mildly decreased function. The left  ventricle demonstrates global hypokinesis. Definity contrast agent was  given IV to delineate the left ventricular   endocardial borders. The left ventricular internal cavity size was normal  in size. There is mild left ventricular hypertrophy. Left ventricular  diastolic parameters are consistent with Grade I diastolic dysfunction  (impaired relaxation).   Right Ventricle: The right ventricular size is normal. No increase in  right ventricular wall thickness. Right ventricular systolic function is  normal.   Left Atrium: Left atrial size was normal in size.   Right Atrium: Right atrial size was normal in size.   Pericardium: There is no evidence of pericardial effusion.   Mitral Valve: The mitral valve is normal  in structure. No evidence of  mitral valve regurgitation. No evidence of mitral valve stenosis.   Tricuspid Valve: The tricuspid valve is normal in structure. Tricuspid  valve regurgitation is not demonstrated. No evidence of tricuspid  stenosis.   Aortic Valve: The aortic valve is normal in structure. Aortic valve  regurgitation is not visualized. No aortic stenosis is present.   Pulmonic Valve: The pulmonic valve was normal in structure. Pulmonic valve   regurgitation is not visualized. No evidence of pulmonic stenosis.   Aorta: Aortic dilatation noted. There is mild dilatation of the ascending  aorta, measuring 41 mm.   Venous: The inferior vena cava is normal in size with greater than 50%  respiratory variability, suggesting right atrial pressure of 3 mmHg.   IAS/Shunts: No atrial level shunt detected by color flow Doppler.   _____________   History of Present Illness     Christopher Guerrero is a 55 y.o. 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  Associated symptoms of rt hand numbness, malaise and waxing and waning of symptoms.  2 NG in med center [place] with resolution of pain.  After transfer he rec'd another NTG for chest pain with relief.  He rec'd ASA, heparin and placed on po imdur.  BP was elevated and had never been this high 140s to 150s systolic.   EKG sinus bradycardia, no ST-T changes. Troponin 83 -> 92 and up to 325.   Pt admitted with plans for cardiac cath.     Hospital Course     Consultants: none  Pt did well overnight and underwent cardiac cath the next morning.  Results as above but with stent to RCA.  Post cath with N&V requiring Zofran and phenergan.  The next morning he felt well no angina and no SOB.  He walked with cardiac rehab without issue.  He does have bradycardia and no BB could be given.  For HTN placed on cozaar.  On high dose statin.  DAPT with Brilinta for 12 months.  Pt instructed on importance of not missing a dose.   He was seen and evaluated by Dr. Jacques NavyAcharya and found stable for discharge.  Follow up arranged.  No work for 3 weeks. Or until appt.     Did the patient have an acute coronary syndrome (MI, NSTEMI, STEMI, etc) this admission?:  Yes                               AHA/ACC Clinical Performance & Quality Measures: Aspirin  prescribed? - Yes ADP Receptor Inhibitor (Plavix/Clopidogrel, Brilinta/Ticagrelor or Effient/Prasugrel) prescribed (includes medically managed patients)? - Yes Beta Blocker prescribed? - No - bradycardia High Intensity Statin (Lipitor 40-80mg  or Crestor 20-40mg ) prescribed? - Yes EF assessed during THIS hospitalization? - Yes For EF <40%, was ACEI/ARB prescribed? - Yes For EF <40%, Aldosterone Antagonist (Spironolactone or Eplerenone) prescribed? - Not Applicable (EF >/= 40%) Cardiac Rehab Phase II ordered (including medically managed patients)? - Yes      _____________  Discharge Vitals Blood pressure 125/78, pulse (!) 51, temperature 97.6 F (36.4 C), temperature source Axillary, resp. rate 18, height 5\' 11"  (1.803 m), weight 89.9 kg, SpO2 96 %.  Filed Weights   06/13/21 1904 06/14/21 0500 06/15/21 0518  Weight: 97.5 kg 93.3 kg 89.9  kg    Labs & Radiologic Studies    CBC Recent Labs    06/14/21 0215 06/15/21 0125  WBC 6.5 9.5  HGB 13.0 13.1  HCT 37.7* 37.7*  MCV 94.7 94.7  PLT 154 154   Basic Metabolic Panel Recent Labs    45/40/98 0215 06/15/21 0125  NA 140 138  K 4.0 3.6  CL 108 107  CO2 26 26  GLUCOSE 99 95  BUN 11 10  CREATININE 1.16 1.25*  CALCIUM 8.7* 9.1  MG 2.5*  --    Liver Function Tests No results for input(s): AST, ALT, ALKPHOS, BILITOT, PROT, ALBUMIN in the last 72 hours. No results for input(s): LIPASE, AMYLASE in the last 72 hours. High Sensitivity Troponin:   Recent Labs  Lab 06/13/21 1907 06/13/21 2026 06/14/21 0215 06/14/21 0622  TROPONINIHS 83* 92* 301* 365*    BNP Invalid input(s): POCBNP D-Dimer No results for input(s): DDIMER in the last 72 hours. Hemoglobin A1C Recent Labs    06/14/21 0215  HGBA1C 5.2   Fasting Lipid Panel Recent Labs    06/14/21 0215  CHOL 138  HDL 28*  LDLCALC 95  TRIG 75  CHOLHDL 4.9   Thyroid Function Tests No results for input(s): TSH, T4TOTAL, T3FREE, THYROIDAB in the last 72  hours.  Invalid input(s): FREET3 _____________  CARDIAC CATHETERIZATION  Result Date: 06/14/2021 Formatting of this result is different from the original.   Mid RCA lesion is 95% stenosed.   A drug-eluting stent was successfully placed using a STENT ONYX FRONTIER 4.5X22.  Stent was optimized with intravascular ultrasound.   Post intervention, there is a 0% residual stenosis.   Dist RCA lesion is 25% stenosed.   Mid LAD lesion is 25% stenosed.   Mid Cx lesion is 40% stenosed.  Ectasia noted in the mid vessel which causes some swirling of contrast.   The left ventricular systolic function is normal.   LV end diastolic pressure is normal.   The left ventricular ejection fraction is 50-55% by visual estimate.   There is no aortic valve stenosis. Continue aspirin and Brilinta for 12 months along with aggressive secondary prevention.  I discussed the results with his wife.  She asked about their upcoming trip to [place].  If his wrist heals well and he does well walking tomorrow, I suspect he will be ok to travel.  I stressed the importance of DAPT to prevent stent thrombosis.   DG Chest Portable 1 View  Result Date: 06/13/2021 CLINICAL DATA:  Chest pain. EXAM: PORTABLE CHEST 1 VIEW COMPARISON:  October 03, 2016 FINDINGS: The heart size and mediastinal contours are within normal limits. Both lungs are clear. The visualized skeletal structures are unremarkable. IMPRESSION: No active disease. Electronically Signed   By: Gerome Sam III M.D   On: 06/13/2021 20:04   ECHOCARDIOGRAM COMPLETE  Result Date: 06/14/2021    ECHOCARDIOGRAM REPORT   Patient Name:   Christopher Guerrero Date of Exam: 06/14/2021 Medical Rec #:  [id number]      Height:       71.0 in Accession #:    [id number]     Weight:       205.7 lb Date of Birth:  [birth year]      BSA:          2.134 m Patient Age:    55 years       BP:           113/82 mmHg Patient Gender: M  HR:           56 bpm. Exam Location:  Inpatient Procedure: 2D Echo,  Cardiac Doppler, Color Doppler and Intracardiac            Opacification Agent Indications:    122-I22.9 Subsequent ST elevation (STEM) and non-ST elevation                 (NSTEMI) myocardial infarction  History:        Patient has prior history of Echocardiogram examinations, most                 recent 02/08/2007. Acute MI; Risk Factors:Hypertension. Patient                 is post cath.  Sonographer:    Sheralyn Boatman RDCS Referring Phys: [id number] [place] TANN[place]  Sonographer Comments: Technically difficult study due to poor echo windows, suboptimal parasternal window, suboptimal apical window, suboptimal subcostal window and no true parasternal window. Image acquisition challenging due to respiratory motion. Nearly non- diagnositic study. Extremely difficult. No true parasternal. Could not turn patient. Supine position. IMPRESSIONS  1. Left ventricular ejection fraction, by estimation, is 45 to 50%. The left ventricle has mildly decreased function. The left ventricle demonstrates global hypokinesis. There is mild left ventricular hypertrophy. Left ventricular diastolic parameters are consistent with Grade I diastolic dysfunction (impaired relaxation).  2. Right ventricular systolic function is normal. The right ventricular size is normal.  3. The mitral valve is normal in structure. No evidence of mitral valve regurgitation. No evidence of mitral stenosis.  4. The aortic valve is normal in structure. Aortic valve regurgitation is not visualized. No aortic stenosis is present.  5. Aortic dilatation noted. There is mild dilatation of the ascending aorta, measuring 41 mm.  6. The inferior vena cava is normal in size with greater than 50% respiratory variability, suggesting right atrial pressure of 3 mmHg. FINDINGS  Left Ventricle: Left ventricular ejection fraction, by estimation, is 45 to 50%. The left ventricle has mildly decreased function. The left ventricle demonstrates global hypokinesis. Definity contrast agent  was given IV to delineate the left ventricular  endocardial borders. The left ventricular internal cavity size was normal in size. There is mild left ventricular hypertrophy. Left ventricular diastolic parameters are consistent with Grade I diastolic dysfunction (impaired relaxation). Right Ventricle: The right ventricular size is normal. No increase in right ventricular wall thickness. Right ventricular systolic function is normal. Left Atrium: Left atrial size was normal in size. Right Atrium: Right atrial size was normal in size. Pericardium: There is no evidence of pericardial effusion. Mitral Valve: The mitral valve is normal in structure. No evidence of mitral valve regurgitation. No evidence of mitral valve stenosis. Tricuspid Valve: The tricuspid valve is normal in structure. Tricuspid valve regurgitation is not demonstrated. No evidence of tricuspid stenosis. Aortic Valve: The aortic valve is normal in structure. Aortic valve regurgitation is not visualized. No aortic stenosis is present. Pulmonic Valve: The pulmonic valve was normal in structure. Pulmonic valve regurgitation is not visualized. No evidence of pulmonic stenosis. Aorta: Aortic dilatation noted. There is mild dilatation of the ascending aorta, measuring 41 mm. Venous: The inferior vena cava is normal in size with greater than 50% respiratory variability, suggesting right atrial pressure of 3 mmHg. IAS/Shunts: No atrial level shunt detected by color flow Doppler.  LEFT VENTRICLE PLAX 2D LVIDd:         4.50 cm  Diastology LVIDs:  3.80 cm  LV e' medial:    6.85 cm/s LV PW:         1.70 cm  LV E/e' medial:  6.3 LV IVS:        1.30 cm  LV e' lateral:   5.87 cm/s LVOT diam:     2.40 cm  LV E/e' lateral: 7.4 LV SV:         72 LV SV Index:   34 LVOT Area:     4.52 cm  RIGHT VENTRICLE RV S prime:     11.60 cm/s TAPSE (M-mode): 1.6 cm LEFT ATRIUM             Index       RIGHT ATRIUM           Index LA diam:        3.30 cm 1.55 cm/m  RA Area:      12.40 cm LA Vol (A2C):   22.7 ml 10.64 ml/m RA Volume:   25.30 ml  11.86 ml/m LA Vol (A4C):   31.0 ml 14.53 ml/m LA Biplane Vol: 28.0 ml 13.12 ml/m  AORTIC VALVE LVOT Vmax:   74.80 cm/s LVOT Vmean:  44.100 cm/s LVOT VTI:    0.159 m  AORTA Ao Root diam: 4.10 cm Ao Asc diam:  4.10 cm MITRAL VALVE MV Area (PHT): 2.22 cm    SHUNTS MV Decel Time: 342 msec    Systemic VTI:  0.16 m MV E velocity: 43.20 cm/s  Systemic Diam: 2.40 cm MV A velocity: 52.30 cm/s MV E/A ratio:  0.83 Donato Schultz MD Electronically signed by Donato Schultz MD Signature Date/Time: 06/14/2021/3:13:23 PM    Final    Disposition   Pt is being discharged home today in good condition.  Follow-up Plans & Appointments  Call [facility] at [phone] if any bleeding, swelling or drainage at cath site.  May shower, no tub baths for 48 hours for groin sticks. No lifting over 5 pounds for 5 days.  No Driving for 5 days  Take 1 NTG, under your tongue, while sitting.  If no relief of pain may repeat NTG, one tab every 5 minutes up to 3 tablets total over 15 minutes.  If no relief CALL 911.  If you have dizziness/lightheadness  while taking NTG, stop taking and call 911.        Heart Healthy Diet    Do not stop asprin and Brilinta they keep your stent open.  Stopping could cause a heart attack.  Follow-up Information     Wendall Stade, MD Follow up on 07/08/2021.   Specialty: Cardiology Why: at 8:30 AM Contact information: [address] [place] [address] [phone]                Discharge Instructions     AMB Referral to Cardiac Rehabilitation - Phase II   Complete by: As directed    Diagnosis:  Coronary Stents NSTEMI     After initial evaluation and assessments completed: Virtual Based Care may be provided alone or in conjunction with Phase 2 Cardiac Rehab based on patient barriers.: Yes       Discharge Medications   Allergies as of 06/15/2021       Reactions    Hydrocodone Hives        Medication List     STOP taking these medications    ondansetron 8 MG disintegrating tablet Commonly known as: ZOFRAN-ODT  TAKE these medications    acetaminophen 325 MG tablet Commonly known as: TYLENOL Take 2 tablets (650 mg total) by mouth every 4 (four) hours as needed for headache or mild pain.   aspirin 81 MG chewable tablet Chew 1 tablet (81 mg total) by mouth daily. Start taking on: June 16, 2021   atorvastatin 80 MG tablet Commonly known as: LIPITOR Take 1 tablet (80 mg total) by mouth daily. Start taking on: June 16, 2021   losartan 25 MG tablet Commonly known as: COZAAR Take 1 tablet (25 mg total) by mouth daily.   nitroGLYCERIN 0.4 MG SL tablet Commonly known as: NITROSTAT Place 1 tablet (0.4 mg total) under the tongue every 5 (five) minutes x 3 doses as needed for chest pain.   pantoprazole 40 MG tablet Commonly known as: PROTONIX Take 1 tablet by mouth daily.   ticagrelor 90 MG Tabs tablet Commonly known as: BRILINTA Take 1 tablet (90 mg total) by mouth 2 (two) times daily.            Outstanding Labs/Studies   Hepatic and lipid in 6 weeks BMP on visit   Duration of Discharge Encounter   Greater than 30 minutes including physician time.  Signed, Nada Boozer, NP 06/15/2021, 9:29 AM  Patient seen and examined. Exam per today's progress note. Stable for discharge on above medical therapy.  Parke Poisson, MD

## 2021-06-15 NOTE — Care Management (Signed)
06-15-21 1128 Brilinta discount co pay card provided to the patient. [facility] Pharm[facility] will place the cost on the medication slip once delivered. No further needs from Case Manager at this time.

## 2021-06-15 NOTE — Discharge Instructions (Addendum)
Call [facility] at [phone] if any bleeding, swelling or drainage at cath site.  May shower, no tub baths for 48 hours for groin sticks. No lifting over 5 pounds for 5 days.  No Driving for 5 days  Take 1 NTG, under your tongue, while sitting.  If no relief of pain may repeat NTG, one tab every 5 minutes up to 3 tablets total over 15 minutes.  If no relief CALL 911.  If you have dizziness/lightheadness  while taking NTG, stop taking and call 911.        Heart Healthy Diet   Do not stop asprin and Brilinta they keep your stent open.  Stopping could cause a heart attack.

## 2021-06-15 NOTE — Progress Notes (Addendum)
Progress Note  Patient Name: Christopher Guerrero Date of Encounter: 06/15/2021  [facility] Cardiologist: Charlton Haws, MD   Subjective   No chest pain or SOB walked in hall with rehab without problems.    Inpatient Medications    Scheduled Meds:  aspirin  81 mg Oral Daily   atorvastatin  80 mg Oral Daily   isosorbide mononitrate  30 mg Oral Daily   pantoprazole  40 mg Oral Daily   sodium chloride flush  3 mL Intravenous Q12H   sodium chloride flush  3 mL Intravenous Q12H   ticagrelor  90 mg Oral BID   Continuous Infusions:  sodium chloride     promethazine (PHENERGAN) injection (IM or IVPB) Stopped (06/14/21 1727)   PRN Meds: sodium chloride, acetaminophen, nitroGLYCERIN, promethazine (PHENERGAN) injection (IM or IVPB), promethazine, sodium chloride flush   Vital Signs    Vitals:   06/14/21 1415 06/14/21 1430 06/14/21 2018 06/15/21 0518  BP: 124/83 (!) 119/106 117/72 125/78  Pulse: (!) 57 (!) 56  (!) 51  Resp:   18 18  Temp:   (!) 97.5 F (36.4 C) 97.6 F (36.4 C)  TempSrc:   Axillary Axillary  SpO2: 97% 99%  96%  Weight:    89.9 kg  Height:        Intake/Output Summary (Last 24 hours) at 06/15/2021 0809 Last data filed at 06/15/2021 0241 Gross per 24 hour  Intake 865.22 ml  Output --  Net 865.22 ml   Last 3 Weights 06/15/2021 06/14/2021 06/13/2021  Weight (lbs) 198 lb 3.1 oz 205 lb 11.2 oz 215 lb  Weight (kg) 89.9 kg 93.305 kg 97.523 kg      Telemetry    SR to SB down to 52 at times - Personally Reviewed  ECG    SR no acute changes but + brady - Personally Reviewed  Physical Exam   GEN: No acute distress.   Neck: No JVD Cardiac: RRR, no murmurs, rubs, or gallops. Rt wrist with mild bruising no hematoma Respiratory: Clear to auscultation bilaterally. GI: Soft, nontender, non-distended  MS: No edema; No deformity. Neuro:  Nonfocal  Psych: Normal affect   Labs    High Sensitivity Troponin:   Recent Labs  Lab 06/13/21 1907 06/13/21 2026  06/14/21 0215 06/14/21 0622  TROPONINIHS 83* 92* 301* 365*      Chemistry Recent Labs  Lab 06/13/21 1907 06/14/21 0215 06/15/21 0125  NA 139 140 138  K 3.9 4.0 3.6  CL 105 108 107  CO2 27 26 26   GLUCOSE 92 99 95  BUN 12 11 10   CREATININE 1.28* 1.16 1.25*  CALCIUM 9.0 8.7* 9.1  GFRNONAA >60 >60 >60  ANIONGAP 7 6 5      Hematology Recent Labs  Lab 06/13/21 1907 06/14/21 0215 06/15/21 0125  WBC 7.3 6.5 9.5  RBC 4.53 3.98* 3.98*  HGB 14.7 13.0 13.1  HCT 42.5 37.7* 37.7*  MCV 93.8 94.7 94.7  MCH 32.5 32.7 32.9  MCHC 34.6 34.5 34.7  RDW 13.1 13.0 13.1  PLT 184 154 154    BNP Recent Labs  Lab 06/14/21 0215  BNP 18.9     DDimer No results for input(s): DDIMER in the last 168 hours.   Radiology    CARDIAC CATHETERIZATION  Result Date: 06/14/2021 Formatting of this result is different from the original.   Mid RCA lesion is 95% stenosed.   A drug-eluting stent was successfully placed using a STENT ONYX FRONTIER 4.5X22.  Stent was  optimized with intravascular ultrasound.   Post intervention, there is a 0% residual stenosis.   Dist RCA lesion is 25% stenosed.   Mid LAD lesion is 25% stenosed.   Mid Cx lesion is 40% stenosed.  Ectasia noted in the mid vessel which causes some swirling of contrast.   The left ventricular systolic function is normal.   LV end diastolic pressure is normal.   The left ventricular ejection fraction is 50-55% by visual estimate.   There is no aortic valve stenosis. Continue aspirin and Brilinta for 12 months along with aggressive secondary prevention.  I discussed the results with his wife.  She asked about their upcoming trip to [place].  If his wrist heals well and he does well walking tomorrow, I suspect he will be ok to travel.  I stressed the importance of DAPT to prevent stent thrombosis.   DG Chest Portable 1 View  Result Date: 06/13/2021 CLINICAL DATA:  Chest pain. EXAM: PORTABLE CHEST 1 VIEW COMPARISON:  October 03, 2016 FINDINGS: The  heart size and mediastinal contours are within normal limits. Both lungs are clear. The visualized skeletal structures are unremarkable. IMPRESSION: No active disease. Electronically Signed   By: Gerome Sam III M.D   On: 06/13/2021 20:04   ECHOCARDIOGRAM COMPLETE  Result Date: 06/14/2021    ECHOCARDIOGRAM REPORT   Patient Name:   Christopher Guerrero Date of Exam: 06/14/2021 Medical Rec #:  [id number]      Height:       71.0 in Accession #:    [id number]     Weight:       205.7 lb Date of Birth:  01/19/1966      BSA:          2.134 m Patient Age:    55 years       BP:           113/82 mmHg Patient Gender: M              HR:           56 bpm. Exam Location:  Inpatient Procedure: 2D Echo, Cardiac Doppler, Color Doppler and Intracardiac            Opacification Agent Indications:    122-I22.9 Subsequent ST elevation (STEM) and non-ST elevation                 (NSTEMI) myocardial infarction  History:        Patient has prior history of Echocardiogram examinations, most                 recent 02/08/2007. Acute MI; Risk Factors:Hypertension. Patient                 is post cath.  Sonographer:    Sheralyn Boatman RDCS Referring Phys: [id number] [place] TANNEN[place]UM  Sonographer Comments: Technically difficult study due to poor echo windows, suboptimal parasternal window, suboptimal apical window, suboptimal subcostal window and no true parasternal window. Image acquisition challenging due to respiratory motion. Nearly non- diagnositic study. Extremely difficult. No true parasternal. Could not turn patient. Supine position. IMPRESSIONS  1. Left ventricular ejection fraction, by estimation, is 45 to 50%. The left ventricle has mildly decreased function. The left ventricle demonstrates global hypokinesis. There is mild left ventricular hypertrophy. Left ventricular diastolic parameters are consistent with Grade I diastolic dysfunction (impaired relaxation).  2. Right ventricular systolic function is normal. The right ventricular size is  normal.  3. The mitral valve is normal in  structure. No evidence of mitral valve regurgitation. No evidence of mitral stenosis.  4. The aortic valve is normal in structure. Aortic valve regurgitation is not visualized. No aortic stenosis is present.  5. Aortic dilatation noted. There is mild dilatation of the ascending aorta, measuring 41 mm.  6. The inferior vena cava is normal in size with greater than 50% respiratory variability, suggesting right atrial pressure of 3 mmHg. FINDINGS  Left Ventricle: Left ventricular ejection fraction, by estimation, is 45 to 50%. The left ventricle has mildly decreased function. The left ventricle demonstrates global hypokinesis. Definity contrast agent was given IV to delineate the left ventricular  endocardial borders. The left ventricular internal cavity size was normal in size. There is mild left ventricular hypertrophy. Left ventricular diastolic parameters are consistent with Grade I diastolic dysfunction (impaired relaxation). Right Ventricle: The right ventricular size is normal. No increase in right ventricular wall thickness. Right ventricular systolic function is normal. Left Atrium: Left atrial size was normal in size. Right Atrium: Right atrial size was normal in size. Pericardium: There is no evidence of pericardial effusion. Mitral Valve: The mitral valve is normal in structure. No evidence of mitral valve regurgitation. No evidence of mitral valve stenosis. Tricuspid Valve: The tricuspid valve is normal in structure. Tricuspid valve regurgitation is not demonstrated. No evidence of tricuspid stenosis. Aortic Valve: The aortic valve is normal in structure. Aortic valve regurgitation is not visualized. No aortic stenosis is present. Pulmonic Valve: The pulmonic valve was normal in structure. Pulmonic valve regurgitation is not visualized. No evidence of pulmonic stenosis. Aorta: Aortic dilatation noted. There is mild dilatation of the ascending aorta, measuring 41  mm. Venous: The inferior vena cava is normal in size with greater than 50% respiratory variability, suggesting right atrial pressure of 3 mmHg. IAS/Shunts: No atrial level shunt detected by color flow Doppler.  LEFT VENTRICLE PLAX 2D LVIDd:         4.50 cm  Diastology LVIDs:         3.80 cm  LV e' medial:    6.85 cm/s LV PW:         1.70 cm  LV E/e' medial:  6.3 LV IVS:        1.30 cm  LV e' lateral:   5.87 cm/s LVOT diam:     2.40 cm  LV E/e' lateral: 7.4 LV SV:         72 LV SV Index:   34 LVOT Area:     4.52 cm  RIGHT VENTRICLE RV S prime:     11.60 cm/s TAPSE (M-mode): 1.6 cm LEFT ATRIUM             Index       RIGHT ATRIUM           Index LA diam:        3.30 cm 1.55 cm/m  RA Area:     12.40 cm LA Vol (A2C):   22.7 ml 10.64 ml/m RA Volume:   25.30 ml  11.86 ml/m LA Vol (A4C):   31.0 ml 14.53 ml/m LA Biplane Vol: 28.0 ml 13.12 ml/m  AORTIC VALVE LVOT Vmax:   74.80 cm/s LVOT Vmean:  44.100 cm/s LVOT VTI:    0.159 m  AORTA Ao Root diam: 4.10 cm Ao Asc diam:  4.10 cm MITRAL VALVE MV Area (PHT): 2.22 cm    SHUNTS MV Decel Time: 342 msec    Systemic VTI:  0.16 m MV E velocity: 43.20 cm/s  Systemic Diam: 2.40 cm MV A velocity: 52.30 cm/s MV E/A ratio:  0.83 Donato Schultz MD Electronically signed by Donato Schultz MD Signature Date/Time: 06/14/2021/3:13:23 PM    Final     Cardiac Studies   Cardiac cath 06/14/21    Mid RCA lesion is 95% stenosed.   A drug-eluting stent was successfully placed using a STENT ONYX FRONTIER 4.5X22.  Stent was optimized with intravascular ultrasound.   Post intervention, there is a 0% residual stenosis.   Dist RCA lesion is 25% stenosed.   Mid LAD lesion is 25% stenosed.   Mid Cx lesion is 40% stenosed.  Ectasia noted in the mid vessel which causes some swirling of contrast.   The left ventricular systolic function is normal.   LV end diastolic pressure is normal.   The left ventricular ejection fraction is 50-55% by visual estimate.   There is no aortic valve stenosis.    Continue aspirin and Brilinta for 12 months along with aggressive secondary prevention.     I discussed the results with his wife.  She asked about their upcoming trip to [place].  If his wrist heals well and he does well walking tomorrow, I suspect he will be ok to travel.  I stressed the importance of DAPT to prevent stent thrombosis.   Diagnostic Dominance: Right    Intervention    Echo 06/14/21 IMPRESSIONS     1. Left ventricular ejection fraction, by estimation, is 45 to 50%. The  left ventricle has mildly decreased function. The left ventricle  demonstrates global hypokinesis. There is mild left ventricular  hypertrophy. Left ventricular diastolic parameters  are consistent with Grade I diastolic dysfunction (impaired relaxation).   2. Right ventricular systolic function is normal. The right ventricular  size is normal.   3. The mitral valve is normal in structure. No evidence of mitral valve  regurgitation. No evidence of mitral stenosis.   4. The aortic valve is normal in structure. Aortic valve regurgitation is  not visualized. No aortic stenosis is present.   5. Aortic dilatation noted. There is mild dilatation of the ascending  aorta, measuring 41 mm.   6. The inferior vena cava is normal in size with greater than 50%  respiratory variability, suggesting right atrial pressure of 3 mmHg.   FINDINGS   Left Ventricle: Left ventricular ejection fraction, by estimation, is 45  to 50%. The left ventricle has mildly decreased function. The left  ventricle demonstrates global hypokinesis. Definity contrast agent was  given IV to delineate the left ventricular   endocardial borders. The left ventricular internal cavity size was normal  in size. There is mild left ventricular hypertrophy. Left ventricular  diastolic parameters are consistent with Grade I diastolic dysfunction  (impaired relaxation).   Right Ventricle: The right ventricular size is normal. No increase in   right ventricular wall thickness. Right ventricular systolic function is  normal.   Left Atrium: Left atrial size was normal in size.   Right Atrium: Right atrial size was normal in size.   Pericardium: There is no evidence of pericardial effusion.   Mitral Valve: The mitral valve is normal in structure. No evidence of  mitral valve regurgitation. No evidence of mitral valve stenosis.   Tricuspid Valve: The tricuspid valve is normal in structure. Tricuspid  valve regurgitation is not demonstrated. No evidence of tricuspid  stenosis.   Aortic Valve: The aortic valve is normal in structure. Aortic valve  regurgitation is not visualized. No aortic stenosis is present.  Pulmonic Valve: The pulmonic valve was normal in structure. Pulmonic valve  regurgitation is not visualized. No evidence of pulmonic stenosis.   Aorta: Aortic dilatation noted. There is mild dilatation of the ascending  aorta, measuring 41 mm.   Venous: The inferior vena cava is normal in size with greater than 50%  respiratory variability, suggesting right atrial pressure of 3 mmHg.   IAS/Shunts: No atrial level shunt detected by color flow Doppler.   Patient Profile     55 y.o. male with borderline hypertension, remote 20 pack year smoking history, early family history of CAD, and BMI of 30 now admitted with NSTEMI  Assessment & Plan    NSTEMI pk troponin 365 on ASA and Brilinta statin no BB due to bradycardia.  Will stop imdur  EKG without acute changes except slower -CAD with 95% stenosis RCA with PCI DES. Mild nonobstructive disease otherwise.  -plan cardiac rehab - no work for 2 weeks (works in [employer]) and stable to travel to [place] in [place] ( discussed little ETOH, water gatorade to drink, he will be diving off cliff and snorkling no scuba). No work for 2-3 weeks will defer to Dr. Jacques NavyAcharya.  -continue DAPT  do not stop  N&V post cath now resolved  HTN on arrival-- today up some will add ARB vs  amlodipine but will defer to Dr. Jacques NavyAcharya with mild bump in Cr.  with mildly decreased EF on echo but not with cath.     HLD on 80 lipitor goal LDL <70  on admit 95.  Recehck in 6-8 weeks.    Cr elevated slightly post cath to 1.25   Sinus brady asymptomatic no BB     For questions or updates, please contact [facility] Please consult [email or web address] for contact info under        Signed, Nada BoozerLaura Ingold, NP  06/15/2021, 8:09 AM     Patient seen and examined with Nada BoozerLaura Ingold NP.  Agree as above, with the following exceptions and changes as noted below.  Feels well with no residual chest pain.  Discussed in detail any restrictions post PCI.  It is imperative he take dual antiplatelet therapy when he travels to [place].  Gen: NAD, CV: RRR, no murmurs, Lungs: clear, Abd: soft, Extrem: Warm, well perfused, no edema, Neuro/Psych: alert and oriented x 3, normal mood and affect. All available labs, radiology testing, previous records reviewed.  Agree with medication therapy as above.  We will plan for hospital discharge today.  Parke PoissonGayatri A Jasten Guyette, MD 06/15/21 10:23 AM

## 2021-06-15 NOTE — Progress Notes (Signed)
Pt is alert and oriented. Discharge instructions/ AVS given to pt. 

## 2021-06-15 NOTE — Progress Notes (Signed)
CARDIAC REHAB PHASE I   PRE:  Rate/Rhythm:   BP:  Supine: 130/90  Sitting:   Standing:    SaO2: 99%RA  MODE:  Ambulation: 1100 ft   POST:  Rate/Rhythm: 94 SR  BP:  Supine:   Sitting: 149/96  Standing:    SaO2: 99%RA [id number] Waited for pt to finish breakfast and then we walked 1100 ft with steady gait and no CP. Tolerated well. MI education completed with pt who voiced understanding. Stressed importance of brilinta with stent. Reviewed NTG use, MI restrictions, walking for ex, heart healthy food choices, and CRP 2. Referred to [place] program. Pt works out of town a lot and usually before 0730 so he will probably not be able to attend.   Luetta Nutting, RN BSN  06/15/2021 9:32 AM  73 SR

## 2021-06-17 ENCOUNTER — Telehealth: Payer: Self-pay | Admitting: Cardiovascular Disease

## 2021-06-17 NOTE — Telephone Encounter (Signed)
Answered all questions about medications and pedicure.

## 2021-06-17 NOTE — Telephone Encounter (Signed)
Patient had a stint put in on Tuesday and is going out the country on Saturday. He has some question that he would like answer before he leaves. Please advise

## 2021-06-18 ENCOUNTER — Other Ambulatory Visit (HOSPITAL_COMMUNITY): Payer: Self-pay

## 2021-06-18 ENCOUNTER — Telehealth (HOSPITAL_COMMUNITY): Payer: Self-pay | Admitting: Pharmacist

## 2021-06-18 NOTE — Telephone Encounter (Signed)
Pharmacy Transitions of Care Follow-up Telephone Call  Date of discharge: 06/15/2021  Discharge Diagnosis: NSTEMI  How have you been since you were released from the hospital? Good   Medication changes made at discharge:  - START: acetaminophen , aspirin, atorvastatin, losartan,  NITROSTAT, ticagrelor  - STOPPED: ondansetron odt  - CHANGED: n/a  Medication changes verified by the patient? Yes (Yes/No)    Medication Accessibility:  Home Pharmacy: [facility] [phone]   Was the patient provided with refills on discharged medications? Yes   Have all prescriptions been transferred from [facility] to home pharmacy? Yes   Is the patient able to afford medications? Yes  Medication Review: TICAGRELOR (BRILINTA) Ticagrelor 90 mg BID  initiated on 06/16/21.  - Educated patient on expected duration of therapy of aspirin/ with ticagrelor. Advised patient that dose of ticagrelor will be /reduced after /. Aspirin will be /continued indefinitely/discontinued. - Discussed importance of taking medication around the same time every day, - Reviewed potential DDIs with patient - Advised patient of medications to avoid (NSAIDs, aspirin maintenance doses>100 mg daily) - Educated that Tylenol (acetaminophen) will be the preferred analgesic to prevent risk of bleeding  - Emphasized importance of monitoring for signs and symptoms of bleeding (abnormal bruising, prolonged bleeding, nose bleeds, bleeding from gums, discolored urine, black tarry stools)  - Educated patient to notify doctor if shortness of breath or abnormal heartbeat occur - Advised patient to alert all providers of antiplatelet therapy prior to starting a new medication or having a procedure     Follow-up Appointments:  [facility] f/u appt confirmed? Yes Scheduled to see [place] on 7/11 If their condition worsens, is the pt aware to call PCP or go to the Emergency Dept.? Yes  Final Patient Assessment: Patient reports he is doing well  since discharge.  He is preparing for a trip out of the country.  Reviewed medications and possible side effects.

## 2021-06-28 ENCOUNTER — Telehealth: Payer: Self-pay | Admitting: Cardiovascular Disease

## 2021-06-28 NOTE — Telephone Encounter (Signed)
Called Pt to advise of recommended tx for Covid.  Attempted to contact PCP but their phone line is shut down at 4:00 pm.  Will attempt to call again in the AM.

## 2021-06-28 NOTE — Telephone Encounter (Signed)
If he is referring to Paxlovid- patient should NOT take this as it interacts with his Brilinta (decrease concentration). Patient had NSTEMI just a few weeks ago. Molnupiravir or a MAB infusion would be an option.

## 2021-06-28 NOTE — Telephone Encounter (Signed)
New Message:     Patient have Covid. His doctor wanted him to check with Dr Lloyd Huger to see if it is alright for him to take the Covid Antibiotic?

## 2021-06-29 NOTE — Telephone Encounter (Signed)
Left detailed message for PCP nurse Morrie Sheldon.  Advised of recommended antivirals for tx of covid.  Left this nurse direct call back if questions.

## 2021-07-01 ENCOUNTER — Telehealth (HOSPITAL_COMMUNITY): Payer: Self-pay

## 2021-07-01 ENCOUNTER — Telehealth: Payer: Self-pay | Admitting: Internal Medicine

## 2021-07-01 NOTE — Telephone Encounter (Signed)
Called and spoke with in regards to CR, pt stated he is not interested at this time. ?  ?Closed referral ?

## 2021-07-01 NOTE — Telephone Encounter (Signed)
Pt insurance is active and benefits verified through [facility]. Co-pay $0.00, DED $3,500.00/$3,381.10 met, out of pocket $5,000.00/$0.00 met, co-insurance 30%. No pre-authorization required. Passport, 07/01/21 @ 9:56AM, UJN#[id number]   Will contact patient to see if he is interested in the Cardiac Rehab Program. If interested, patient will need to complete follow up appt. Once completed, patient will be contacted for scheduling upon review by the RN Navigator.

## 2021-07-01 NOTE — Telephone Encounter (Signed)
Spoke with the patient who states that he was origianlly supposed to be out of work until he was seen for follow up. He was originally scheduled for 8/11, however it had to be rescheduled until 9/9. He would like to know if he has to wait until that appointment to return to work or if he can go back sooner. If he is able to go back he needs to know what restrictions if any that he has.

## 2021-07-01 NOTE — Telephone Encounter (Signed)
Patient called in to say that he needs a note on when he can go back to work as well as what he is allow and not allowed to do at work. Please advise

## 2021-07-01 NOTE — Telephone Encounter (Signed)
Spoke with the patient and advised him that it is okay for him to return to work per Dr. Jacques Navy. He would like to pick up a letter that he can take to his work so that he can return.

## 2021-07-01 NOTE — Telephone Encounter (Signed)
Spoke with patient, advised patient that letter is available at front desk for pick up. Patient verbalized understanding.

## 2021-07-08 ENCOUNTER — Ambulatory Visit: Payer: BC Managed Care – PPO | Admitting: Cardiovascular Disease

## 2021-07-12 ENCOUNTER — Telehealth: Payer: Self-pay | Admitting: Internal Medicine

## 2021-07-12 NOTE — Telephone Encounter (Signed)
Follow Up:     Patient says he can not afford the Brilinta and would like for Dr Jacques Navy to prescribe something else please. Whatever she prescribe he would like for you to ahead and call it in please.

## 2021-07-12 NOTE — Telephone Encounter (Signed)
Pt is calling to inform Dr. Jacques Navy and RN that he only has 3 days left of his Brilinta supply, and he cannot afford the cost of $500 a month for this med. Pt is asking for samples to be picked up at the [facility] office, and if there is some sort of pt assistance for this medication he can apply for. Informed the pt that I will route this message to Dr. Lupe Carney RN to make her aware of this issue, and to go ahead and start the process in helping to the pt apply for pt assistance for this medication. Informed the pt that I will have a nurse at the [facility] office leave him some Brilinta samples to pick up, while awaiting the process of his pt assistance for this med.  Pt verbalized understanding and agrees with this plan. Pt was more than gracious for all the assistance provided.

## 2021-07-12 NOTE — Telephone Encounter (Signed)
Spoke to patient Brilinta 90 mg samples Lot # [id number] Exp 08/28/23 left at [facility] office front desk along with a patient assistance application.Advised to complete application and bring back with proof of income.Advised to give to Dr.Acharya's RN.

## 2021-07-30 NOTE — Telephone Encounter (Signed)
Advised patient that patient assistance forms have been faxed. Patient states that he still has enough of his Brillinta, advised patient to call back if any samples are needed and advised patient of the importance of not missing any doses. Patient verbalized understanding.

## 2021-08-06 ENCOUNTER — Ambulatory Visit: Payer: BC Managed Care – PPO | Admitting: Internal Medicine

## 2021-08-06 ENCOUNTER — Encounter: Payer: Self-pay | Admitting: Internal Medicine

## 2021-08-06 ENCOUNTER — Other Ambulatory Visit: Payer: Self-pay

## 2021-08-06 VITALS — BP 124/92 | HR 54 | Ht 71.0 in | Wt 199.4 lb

## 2021-08-06 DIAGNOSIS — Z95820 Peripheral vascular angioplasty status with implants and grafts: Secondary | ICD-10-CM | POA: Diagnosis not present

## 2021-08-06 DIAGNOSIS — I2510 Atherosclerotic heart disease of native coronary artery without angina pectoris: Secondary | ICD-10-CM

## 2021-08-06 DIAGNOSIS — I214 Non-ST elevation (NSTEMI) myocardial infarction: Secondary | ICD-10-CM | POA: Diagnosis not present

## 2021-08-06 DIAGNOSIS — E7800 Pure hypercholesterolemia, unspecified: Secondary | ICD-10-CM

## 2021-08-06 DIAGNOSIS — I10 Essential (primary) hypertension: Secondary | ICD-10-CM

## 2021-08-06 MED ORDER — CLOPIDOGREL BISULFATE 300 MG PO TABS: 300.0000 mg | ORAL_TABLET | Freq: Once | ORAL | 0 refills | Status: AC

## 2021-08-06 MED ORDER — CLOPIDOGREL BISULFATE 75 MG PO TABS: 75.0000 mg | ORAL_TABLET | Freq: Every day | ORAL | 3 refills | Status: DC

## 2021-08-06 NOTE — Patient Instructions (Addendum)
Medication Instructions:  TAKE LAST DOSE OF BRILLINTA ON September 28th 2022  ON September 29th TAKE PLAVIX (CLOPIDOGREL) 300mg  (1) TABLET   ON September 30th TAKE PLAVIX (CLOPIDOGREL) 75mg  (1) TABLET DAILY THEREAFTER.   DO NOT STOP OR MISS ANY DOSES OF PLAVIX WITH OUT SPEAKING TO Dr. October 02 FIRST *If you need a refill on your cardiac medications before your next appointment, please call your pharmacy*  Lab Work: PLEASE RETURN FOR FASTING LAB WORK AROUND October 17th. NO APPOINTMENT NEEDED. LAB HOURS ARE 8am to 4pm Monday -Friday  If you have labs (blood work) drawn today and your tests are completely normal, you will receive your results only by: MyChart Message (if you have MyChart) OR A paper copy in the mail If you have any lab test that is abnormal or we need to change your treatment, we will call you to review the results.  Testing/Procedures: Your physician has requested that you have an echocardiogram ON October 17th IF POSSIBLE . Echocardiography is a painless test that uses sound waves to create images of your heart. It provides your doctor with information about the size and shape of your heart and how well your heart's chambers and valves are working. You may receive an ultrasound enhancing agent through an IV if needed to better visualize your heart during the echo.This procedure takes approximately one hour. There are no restrictions for this procedure. This will take place at the [address], [address].   Follow-Up: At [facility], you and your health needs are our priority.  As part of our continuing mission to provide you with exceptional heart care, we have created designated Provider Care Teams.  These Care Teams include your primary Cardiologist (physician) and Advanced Practice Providers (APPs -  Physician Assistants and Nurse Practitioners) who all work together to provide you with the care you need, when you need it.  We recommend signing up for the patient  portal called "MyChart".  Sign up information is provided on this After Visit Summary.  MyChart is used to connect with patients for Virtual Visits (Telemedicine).  Patients are able to view lab/test results, encounter notes, upcoming appointments, etc.  Non-urgent messages can be sent to your provider as well.   To learn more about what you can do with MyChart, go to [address].    Your next appointment:   October 27th at 2:20pm   The format for your next appointment:   In Person  Provider:   [email or web address], MD

## 2021-08-06 NOTE — Progress Notes (Signed)
Cardiology Office Note:    Date:  08/06/2021   ID:  MUHANNAD BIGNELL, DOB May 10, 1966, MRN [id number]  PCP:  Adrian Prince, MD  Cardiologist:  Charlton Haws, MD  Electrophysiologist:  None   Referring MD: Lewis Moccasin, MD   Chief Complaint/Reason for Referral: CAD  History of Present Illness:    PAMELA MADDY is a 55 y.o. male with a history of remote 20 pack year smoking history, premature family history of CAD, and BMI of 30, who presented with NSTEMI in 06/14/21 now s/p PCI to mid RCA.   Doing well overall, enjoyed trip to [place] without event. Occasional mild chest discomfort but othewise no issues. Difficult affording Brilinta due to high cost, no SOB with Brilinta.   The patient denies dyspnea at rest or with exertion, PND, orthopnea, or leg swelling. Denies cough, fever, chills, nausea, or vomiting. Denies syncope, presyncope, or snoring. Denies dizziness or lightheadedness.     Past Medical History:  Diagnosis Date   CAD in native artery 06/15/2021   Diverticulitis    GERD (gastroesophageal reflux disease)    Gout    HLD (hyperlipidemia) 06/15/2021   S/P angioplasty with stent 06/14/21 DES to RCA 06/15/2021    Past Surgical History:  Procedure Laterality Date   ABDOMINAL DEBRIDEMENT  2009   APPENDECTOMY  2008   COLECTOMY  2007   sigmoid colon resection and colostomy   COLON SURGERY  2008   colostomy closure   COLON SURGERY  2008   resection of enterocutaneous fistula, small bowel resection, L colon resection with primary anastomosis, abd wall resection   CORONARY STENT INTERVENTION N/A 06/14/2021   Procedure: CORONARY STENT INTERVENTION;  Surgeon: Corky Crafts, MD;  Location: [facility] INVASIVE CV LAB;  Service: Cardiovascular;  Laterality: N/A;   INTRAVASCULAR ULTRASOUND/IVUS N/A 06/14/2021   Procedure: Intravascular Ultrasound/IVUS;  Surgeon: Corky Crafts, MD;  Location: [facility] INVASIVE CV LAB;  Service: Cardiovascular;  Laterality: N/A;   LEFT HEART CATH  AND CORONARY ANGIOGRAPHY N/A 06/14/2021   Procedure: LEFT HEART CATH AND CORONARY ANGIOGRAPHY;  Surgeon: Corky Crafts, MD;  Location: [facility] INVASIVE CV LAB;  Service: Cardiovascular;  Laterality: N/A;    Current Medications: Current Meds  Medication Sig   acetaminophen (TYLENOL) 325 MG tablet Take 2 tablets (650 mg total) by mouth every 4 (four) hours as needed for headache or mild pain.   aspirin 81 MG chewable tablet Chew 1 tablet (81 mg total) by mouth daily.   atorvastatin (LIPITOR) 80 MG tablet Take 1 tablet (80 mg total) by mouth daily.   losartan (COZAAR) 25 MG tablet Take 1 tablet (25 mg total) by mouth daily.   nitroGLYCERIN (NITROSTAT) 0.4 MG SL tablet Place 1 tablet (0.4 mg total) under the tongue every 5 (five) minutes x 3 doses as needed for chest pain.   pantoprazole (PROTONIX) 40 MG tablet Take 1 tablet by mouth daily.   ticagrelor (BRILINTA) 90 MG TABS tablet Take 1 tablet (90 mg total) by mouth 2 (two) times daily.     Allergies:   Hydrocodone   Social History   Tobacco Use   Smoking status: Some Days    Packs/day: 20.00    Years: 20.00    Pack years: 400.00    Types: Cigarettes, Cigars   Smokeless tobacco: Never  Substance Use Topics   Alcohol use: Yes   Drug use: No     Family History: The patient's family history includes Arthritis in an other family member; Coronary  artery disease in an other family member; Hypertension in an other family member.  ROS:   Please see the history of present illness.    All other systems reviewed and are negative.  EKGs/Labs/Other Studies Reviewed:    The following studies were reviewed today:  EKG:  Sinus bradycardia with sinus arrhythmia, rate 58 bpm.   Imaging studies that I have independently reviewed today: n/a  Recent Labs: 06/14/2021: B Natriuretic Peptide 18.9; Magnesium 2.5 06/15/2021: BUN 10; Creatinine, Ser 1.25; Hemoglobin 13.1; Platelets 154; Potassium 3.6; Sodium 138  Recent Lipid Panel    Component  Value Date/Time   CHOL 138 06/14/2021 0215   CHOL 168 06/30/2017 0846   TRIG 75 06/14/2021 0215   HDL 28 (L) 06/14/2021 0215   HDL 35 (L) 06/30/2017 0846   CHOLHDL 4.9 06/14/2021 0215   VLDL 15 06/14/2021 0215   LDLCALC 95 06/14/2021 0215   LDLCALC 104 (H) 06/30/2017 0846    Physical Exam:    VS:  BP (!) 124/92   Pulse (!) 54   Ht 5\' 11"  (1.803 m)   Wt 199 lb 6.4 oz (90.4 kg)   SpO2 99%   BMI 27.81 kg/m     Wt Readings from Last 5 Encounters:  08/06/21 199 lb 6.4 oz (90.4 kg)  06/15/21 198 lb 3.1 oz (89.9 kg)  06/30/17 216 lb (98 kg)  10/07/16 213 lb 12.8 oz (97 kg)  10/02/16 220 lb (99.8 kg)    Constitutional: No acute distress Eyes: sclera non-icteric, normal conjunctiva and lids ENMT: normal dentition, moist mucous membranes Cardiovascular: regular rhythm, normal rate, no murmurs. S1 and S2 normal. Radial pulses normal bilaterally. No jugular venous distention.  Respiratory: clear to auscultation bilaterally GI : normal bowel sounds, soft and nontender. No distention.   MSK: extremities warm, well perfused. No edema.  NEURO: grossly nonfocal exam, moves all extremities. PSYCH: alert and oriented x 3, normal mood and affect.   ASSESSMENT:    1. NSTEMI (non-ST elevated myocardial infarction) (HCC)   2. S/P angioplasty with stent 06/14/21 DES to RCA   3. CAD in native artery   4. Pure hypercholesterolemia   5. Primary hypertension    PLAN:    NSTEMI (non-ST elevated myocardial infarction) (HCC) - Plan: EKG 12-Lead, Lipid panel, Basic metabolic panel, ECHOCARDIOGRAM COMPLETE S/P angioplasty with stent 06/14/21 DES to RCA CAD in native artery - Plan: EKG 12-Lead, Lipid panel, Basic metabolic panel, ECHOCARDIOGRAM COMPLETE - continue ASA 81 mg daily. Will reload and transition to plavix 75 mg daily for cost.  - mildly reduced EF on echo, will repeat echo. Continue losartan 25 mg daily. No BB with baseline bradycardia. - I have instructed the patient that dual  antiplatelet therapy should be taken for 1 year without interruption.  We have discussed the consequences of interrupted dual antiplatelet therapy and the risk for in-stent thrombosis.   Pure hypercholesterolemia - continue statin, lipitor 80 mg daily. Will repeat labs.  Primary hypertension - losartan 25 mg daily, continue, BP appropriate.  Total time of encounter: 30 minutes total time of encounter, including 25 minutes spent in face-to-face patient care on the date of this encounter. This time includes coordination of care and counseling regarding above mentioned problem list. Remainder of non-face-to-face time involved reviewing chart documents/testing relevant to the patient encounter and documentation in the medical record. I have independently reviewed documentation from referring provider.   06/16/21, MD, [facility]  [facility]   Shared Decision Making/Informed Consent:  Medication Adjustments/Labs and Tests Ordered: Current medicines are reviewed at length with the patient today.  Concerns regarding medicines are outlined above.   Orders Placed This Encounter  Procedures   Lipid panel   Basic metabolic panel   EKG 12-Lead   ECHOCARDIOGRAM COMPLETE    Meds ordered this encounter  Medications   clopidogrel (PLAVIX) 300 MG TABS tablet    Sig: Take 1 tablet (300 mg total) by mouth once for 1 dose. PLEASE TAKE 300 MG (1) TABLET on September 29th. On September 30th CONTINUE 75mg  (1) TABLET DAILY THEREAFTER. DO NOT STOP UNTIL DISCUSSION WITH CARDIOLOGY    Dispense:  1 tablet    Refill:  0   clopidogrel (PLAVIX) 75 MG tablet    Sig: Take 1 tablet (75 mg total) by mouth daily. START THIS ON September 30th. TAKE (1) 75mg  TABLET DAILY. DO NOT DISCONTINUE WITH OUT CARDIOLOGY APPROVAL    Dispense:  90 tablet    Refill:  3    Patient Instructions  Medication Instructions:  TAKE LAST DOSE OF BRILLINTA ON September 28th 2022  ON September 29th TAKE PLAVIX  (CLOPIDOGREL) 300mg  (1) TABLET   ON September 30th TAKE PLAVIX (CLOPIDOGREL) 75mg  (1) TABLET DAILY THEREAFTER.   DO NOT STOP OR MISS ANY DOSES OF PLAVIX WITH OUT SPEAKING TO Dr. October 01 FIRST *If you need a refill on your cardiac medications before your next appointment, please call your pharmacy*  Lab Work: PLEASE RETURN FOR FASTING LAB WORK AROUND October 17th. NO APPOINTMENT NEEDED. LAB HOURS ARE 8am to 4pm Monday -Friday  If you have labs (blood work) drawn today and your tests are completely normal, you will receive your results only by: MyChart Message (if you have MyChart) OR A paper copy in the mail If you have any lab test that is abnormal or we need to change your treatment, we will call you to review the results.  Testing/Procedures: Your physician has requested that you have an echocardiogram ON October 17th IF POSSIBLE . Echocardiography is a painless test that uses sound waves to create images of your heart. It provides your doctor with information about the size and shape of your heart and how well your heart's chambers and valves are working. You may receive an ultrasound enhancing agent through an IV if needed to better visualize your heart during the echo.This procedure takes approximately one hour. There are no restrictions for this procedure. This will take place at the [address], [address].   Follow-Up: At [facility], you and your health needs are our priority.  As part of our continuing mission to provide you with exceptional heart care, we have created designated Provider Care Teams.  These Care Teams include your primary Cardiologist (physician) and Advanced Practice Providers (APPs -  Physician Assistants and Nurse Practitioners) who all work together to provide you with the care you need, when you need it.  We recommend signing up for the patient portal called "MyChart".  Sign up information is provided on this After Visit Summary.  MyChart is used to connect  with patients for Virtual Visits (Telemedicine).  Patients are able to view lab/test results, encounter notes, upcoming appointments, etc.  Non-urgent messages can be sent to your provider as well.   To learn more about what you can do with MyChart, go to [phone].    Your next appointment:   October 27th at 2:20pm   The format for your next appointment:   In Person  Provider:  Cherlynn Kaiser, MD

## 2021-08-30 DIAGNOSIS — R7301 Impaired fasting glucose: Secondary | ICD-10-CM | POA: Diagnosis not present

## 2021-08-30 DIAGNOSIS — Z23 Encounter for immunization: Secondary | ICD-10-CM | POA: Diagnosis not present

## 2021-09-08 DIAGNOSIS — I214 Non-ST elevation (NSTEMI) myocardial infarction: Secondary | ICD-10-CM | POA: Diagnosis not present

## 2021-09-08 DIAGNOSIS — I2510 Atherosclerotic heart disease of native coronary artery without angina pectoris: Secondary | ICD-10-CM | POA: Diagnosis not present

## 2021-09-08 LAB — LIPID PANEL
Chol/HDL Ratio: 2.3 ratio (ref 0.0–5.0)
Cholesterol, Total: 84 mg/dL — ABNORMAL LOW (ref 100–199)
HDL: 37 mg/dL — ABNORMAL LOW (ref >39)
LDL Chol Calc (NIH): 32 mg/dL (ref 0–99)
Triglycerides: 70 mg/dL (ref 0–149)
VLDL Cholesterol Cal: 15 mg/dL (ref 5–40)

## 2021-09-08 LAB — BASIC METABOLIC PANEL
BUN/Creatinine Ratio: 9 (ref 9–20)
BUN: 10 mg/dL (ref 6–24)
CO2: 25 mmol/L (ref 20–29)
Calcium: 9.6 mg/dL (ref 8.7–10.2)
Chloride: 104 mmol/L (ref 96–106)
Creatinine, Ser: 1.07 mg/dL (ref 0.76–1.27)
Glucose: 93 mg/dL (ref 70–99)
Potassium: 4.4 mmol/L (ref 3.5–5.2)
Sodium: 140 mmol/L (ref 134–144)
eGFR: 82 mL/min/{1.73_m2} (ref >59)

## 2021-09-13 ENCOUNTER — Ambulatory Visit (HOSPITAL_COMMUNITY): Payer: BC Managed Care – PPO | Attending: Internal Medicine

## 2021-09-13 ENCOUNTER — Other Ambulatory Visit: Payer: Self-pay

## 2021-09-13 DIAGNOSIS — I214 Non-ST elevation (NSTEMI) myocardial infarction: Secondary | ICD-10-CM | POA: Diagnosis not present

## 2021-09-13 DIAGNOSIS — I2510 Atherosclerotic heart disease of native coronary artery without angina pectoris: Secondary | ICD-10-CM | POA: Diagnosis not present

## 2021-09-13 LAB — ECHOCARDIOGRAM COMPLETE
Area-P 1/2: 3.17 cm2
P 1/2 time: 322 ms
S' Lateral: 3.2 cm

## 2021-09-23 ENCOUNTER — Ambulatory Visit: Payer: BC Managed Care – PPO | Admitting: Internal Medicine

## 2021-09-23 ENCOUNTER — Encounter: Payer: Self-pay | Admitting: Internal Medicine

## 2021-09-23 ENCOUNTER — Other Ambulatory Visit: Payer: Self-pay

## 2021-09-23 VITALS — BP 108/76 | HR 68 | Ht 71.0 in | Wt 193.0 lb

## 2021-09-23 DIAGNOSIS — E7800 Pure hypercholesterolemia, unspecified: Secondary | ICD-10-CM | POA: Diagnosis not present

## 2021-09-23 DIAGNOSIS — I214 Non-ST elevation (NSTEMI) myocardial infarction: Secondary | ICD-10-CM | POA: Diagnosis not present

## 2021-09-23 DIAGNOSIS — Z95820 Peripheral vascular angioplasty status with implants and grafts: Secondary | ICD-10-CM

## 2021-09-23 DIAGNOSIS — I2510 Atherosclerotic heart disease of native coronary artery without angina pectoris: Secondary | ICD-10-CM

## 2021-09-23 DIAGNOSIS — I10 Essential (primary) hypertension: Secondary | ICD-10-CM

## 2021-09-23 DIAGNOSIS — I77810 Thoracic aortic ectasia: Secondary | ICD-10-CM

## 2021-09-23 MED ORDER — ATORVASTATIN CALCIUM 80 MG PO TABS: 80.0000 mg | ORAL_TABLET | Freq: Every day | ORAL | 3 refills | Status: DC

## 2021-09-23 MED ORDER — LOSARTAN POTASSIUM 25 MG PO TABS: 25.0000 mg | ORAL_TABLET | Freq: Every day | ORAL | 3 refills | Status: DC

## 2021-09-23 NOTE — Patient Instructions (Signed)
Medication Instructions:  °No Changes In Medications at this time.  °*If you need a refill on your cardiac medications before your next appointment, please call your pharmacy* ° °Follow-Up: °At [facility], you and your health needs are our priority.  As part of our continuing mission to provide you with exceptional heart care, we have created designated Provider Care Teams.  These Care Teams include your primary Cardiologist (physician) and Advanced Practice Providers (APPs -  Physician Assistants and Nurse Practitioners) who all work together to provide you with the care you need, when you need it. ° °We recommend signing up for the patient portal called "MyChart".  Sign up information is provided on this After Visit Summary.  MyChart is used to connect with patients for Virtual Visits (Telemedicine).  Patients are able to view lab/test results, encounter notes, upcoming appointments, etc.  Non-urgent messages can be sent to your provider as well.   °To learn more about what you can do with MyChart, go to [email or web address].   ° °Your next appointment:   °6 month(s) ° °The format for your next appointment:   °In Person ° °Provider:   °Gayatri Acharya, MD °

## 2021-09-23 NOTE — Progress Notes (Signed)
Cardiology Office Note:    Date:  09/23/2021   ID:  Christopher Guerrero, DOB 09/06/1966, MRN [id number]  PCP:  Adrian Prince, MD  Cardiologist:  Charlton Haws, MD  Electrophysiologist:  None   Referring MD: Adrian Prince, MD   Chief Complaint/Reason for Referral: NSTEMI  History of Present Illness:    Christopher Guerrero is a 55 y.o. male with a history of remote 20 pack year smoking history, premature family history of CAD, and BMI of 30, who presented with NSTEMI in 06/14/21 now s/p PCI to mid RCA.   We discussed his labs and echo from the hospital. He was concerned about his kidney function from the hospital. However, his kidney function seemed to be improving. Cholesterol also improving. His aorta is mildly dilated on recent echo, 41 mm. EF recovered to low normal 53%.   He is tolerating the atorvastatin. He is also tolerating the change to Plavix and reports not bruising as much. On Brilinta, he was experiencing chest tightness. However, he does not report chest tightness after discontinuing Brilinta.   For work, he often carries large objects. I advised him against trying to lift heavier objects because of his dilated aorta.  For exercise, he intends to go to the gym more often. He is more aware of his salt consumption. He follows a good diet with his wife.  He has not required SL NTG.  The patient denies chest pain, chest pressure, dyspnea at rest or with exertion, PND, orthopnea, or leg swelling. Denies cough, fever, chills, nausea, or vomiting. Denies syncope, presyncope, or snoring. Denies dizziness or lightheadedness.    Past Medical History:  Diagnosis Date   CAD in native artery 06/15/2021   Diverticulitis    GERD (gastroesophageal reflux disease)    Gout    HLD (hyperlipidemia) 06/15/2021   S/P angioplasty with stent 06/14/21 DES to RCA 06/15/2021    Past Surgical History:  Procedure Laterality Date   ABDOMINAL DEBRIDEMENT  2009   APPENDECTOMY  2008   COLECTOMY  2007    sigmoid colon resection and colostomy   COLON SURGERY  2008   colostomy closure   COLON SURGERY  2008   resection of enterocutaneous fistula, small bowel resection, L colon resection with primary anastomosis, abd wall resection   CORONARY STENT INTERVENTION N/A 06/14/2021   Procedure: CORONARY STENT INTERVENTION;  Surgeon: Corky Crafts, MD;  Location: [facility] INVASIVE CV LAB;  Service: Cardiovascular;  Laterality: N/A;   INTRAVASCULAR ULTRASOUND/IVUS N/A 06/14/2021   Procedure: Intravascular Ultrasound/IVUS;  Surgeon: Corky Crafts, MD;  Location: [facility] INVASIVE CV LAB;  Service: Cardiovascular;  Laterality: N/A;   LEFT HEART CATH AND CORONARY ANGIOGRAPHY N/A 06/14/2021   Procedure: LEFT HEART CATH AND CORONARY ANGIOGRAPHY;  Surgeon: Corky Crafts, MD;  Location: [facility] INVASIVE CV LAB;  Service: Cardiovascular;  Laterality: N/A;    Current Medications: Current Meds  Medication Sig   acetaminophen (TYLENOL) 325 MG tablet Take 2 tablets (650 mg total) by mouth every 4 (four) hours as needed for headache or mild pain.   aspirin 81 MG chewable tablet Chew 1 tablet (81 mg total) by mouth daily.   atorvastatin (LIPITOR) 80 MG tablet Take 1 tablet (80 mg total) by mouth daily.   clopidogrel (PLAVIX) 75 MG tablet Take 1 tablet (75 mg total) by mouth daily. START THIS ON September 30th. TAKE (1) 75mg  TABLET DAILY. DO NOT DISCONTINUE WITH OUT CARDIOLOGY APPROVAL   Cyanocobalamin (VITAMIN B-12 PO) Take 500 mg by mouth  daily.   losartan (COZAAR) 25 MG tablet Take 1 tablet (25 mg total) by mouth daily.   nitroGLYCERIN (NITROSTAT) 0.4 MG SL tablet Place 1 tablet (0.4 mg total) under the tongue every 5 (five) minutes x 3 doses as needed for chest pain.   pantoprazole (PROTONIX) 40 MG tablet Take 1 tablet by mouth daily.     Allergies:   Hydrocodone   Social History   Tobacco Use   Smoking status: Some Days    Packs/day: 20.00    Years: 20.00    Pack years: 400.00    Types: Cigarettes,  Cigars   Smokeless tobacco: Never  Substance Use Topics   Alcohol use: Yes   Drug use: No     Family History: The patient's family history includes Arthritis in an other family member; Coronary artery disease in an other family member; Hypertension in an other family member.  ROS:   Please see the history of present illness.    (+) Strain when lifting All other systems reviewed and are negative.  EKGs/Labs/Other Studies Reviewed:    The following studies were reviewed today: Echo 09/13/21 1. Left ventricular ejection fraction by 3D volume is 53 %. The left  ventricle has normal function. The left ventricle has no regional wall  motion abnormalities. Left ventricular diastolic parameters were normal.   2. Right ventricular systolic function is normal. The right ventricular  size is normal. Tricuspid regurgitation signal is inadequate for assessing  PA pressure.   3. The mitral valve is normal in structure. Trivial mitral valve  regurgitation. No evidence of mitral stenosis.   4. The aortic valve is tricuspid. Aortic valve regurgitation is trivial.  No aortic stenosis is present.   5. Aortic dilatation noted. There is mild dilatation of the aortic root,  measuring 41 mm.   6. The inferior vena cava is normal in size with greater than 50%  respiratory variability, suggesting right atrial pressure of 3 mmHg.   Echo 06/14/21 1. Left ventricular ejection fraction, by estimation, is 45 to 50%. The  left ventricle has mildly decreased function. The left ventricle  demonstrates global hypokinesis. There is mild left ventricular  hypertrophy. Left ventricular diastolic parameters  are consistent with Grade I diastolic dysfunction (impaired relaxation).   2. Right ventricular systolic function is normal. The right ventricular  size is normal.   3. The mitral valve is normal in structure. No evidence of mitral valve  regurgitation. No evidence of mitral stenosis.   4. The aortic valve  is normal in structure. Aortic valve regurgitation is  not visualized. No aortic stenosis is present.   5. Aortic dilatation noted. There is mild dilatation of the ascending  aorta, measuring 41 mm.   6. The inferior vena cava is normal in size with greater than 50%  respiratory variability, suggesting right atrial pressure of 3 mmHg.  L Heart Cath 06/14/21  Mid RCA lesion is 95% stenosed.   A drug-eluting stent was successfully placed using a STENT ONYX FRONTIER 4.5X22.  Stent was optimized with intravascular ultrasound.   Post intervention, there is a 0% residual stenosis.   Dist RCA lesion is 25% stenosed.   Mid LAD lesion is 25% stenosed.   Mid Cx lesion is 40% stenosed.  Ectasia noted in the mid vessel which causes some swirling of contrast.   The left ventricular systolic function is normal.   LV end diastolic pressure is normal.   The left ventricular ejection fraction is 50-55% by visual estimate.  There is no aortic valve stenosis.  EKG:   09/23/21: No EKG ordered today 08/06/21: Sinus bradycardia with sinus arrhythmia, rate 58 bpm.   Imaging studies that I have independently reviewed today: n/a  Recent Labs: 06/14/2021: B Natriuretic Peptide 18.9; Magnesium 2.5 06/15/2021: Hemoglobin 13.1; Platelets 154 09/08/2021: BUN 10; Creatinine, Ser 1.07; Potassium 4.4; Sodium 140  Recent Lipid Panel    Component Value Date/Time   CHOL 84 (L) 09/08/2021 0844   TRIG 70 09/08/2021 0844   HDL 37 (L) 09/08/2021 0844   CHOLHDL 2.3 09/08/2021 0844   CHOLHDL 4.9 06/14/2021 0215   VLDL 15 06/14/2021 0215   LDLCALC 32 09/08/2021 0844    Physical Exam:    VS:  BP 108/76   Pulse 68   Ht 5\' 11"  (1.803 m)   Wt 193 lb (87.5 kg)   SpO2 97%   BMI 26.92 kg/m     Wt Readings from Last 5 Encounters:  09/23/21 193 lb (87.5 kg)  08/06/21 199 lb 6.4 oz (90.4 kg)  06/15/21 198 lb 3.1 oz (89.9 kg)  06/30/17 216 lb (98 kg)  10/07/16 213 lb 12.8 oz (97 kg)    Constitutional: No acute  distress Eyes: sclera non-icteric, normal conjunctiva and lids ENMT: normal dentition, moist mucous membranes Cardiovascular: regular rhythm, normal rate, no murmurs. S1 and S2 normal. Radial pulses normal bilaterally. No jugular venous distention.  Respiratory: clear to auscultation bilaterally GI : normal bowel sounds, soft and nontender. No distention.   MSK: extremities warm, well perfused. No edema.  NEURO: grossly nonfocal exam, moves all extremities. PSYCH: alert and oriented x 3, normal mood and affect.   ASSESSMENT:    1. NSTEMI (non-ST elevated myocardial infarction) (HCC)   2. S/P angioplasty with stent 06/14/21 DES to RCA   3. CAD in native artery   4. Pure hypercholesterolemia   5. Primary hypertension    PLAN:    NSTEMI (non-ST elevated myocardial infarction) (HCC) -  S/P angioplasty with stent 06/14/21 DES to RCA CAD in native artery  - continue ASA 81 mg daily and Plavix 75 mg daily. I have instructed the patient that dual antiplatelet therapy should be taken for 1 year without interruption.  We have discussed the consequences of interrupted dual antiplatelet therapy and the risk for in-stent thrombosis.  - plavix stop date tentatively 06/14/2022 - mildly reduced EF on echo however slightly improved on repeat. Continue losartan 25 mg daily. No BB with baseline bradycardia.  Pure hypercholesterolemia - continue statin, lipitor 80 mg daily. LDL at goal.   Primary hypertension - losartan 25 mg daily, continue, BP appropriate.  Mild dilation of ascending aorta - repeat an echo in a year.   Total time of encounter: 30 minutes total time of encounter, including 20 minutes spent in face-to-face patient care on the date of this encounter. This time includes coordination of care and counseling regarding above mentioned problem list. Remainder of non-face-to-face time involved reviewing chart documents/testing relevant to the patient encounter and documentation in the medical  record. I have independently reviewed documentation from referring provider.   06/16/2022, MD, [facility]  [facility]   Shared Decision Making/Informed Consent:       Medication Adjustments/Labs and Tests Ordered: Current medicines are reviewed at length with the patient today.  Concerns regarding medicines are outlined above.   No orders of the defined types were placed in this encounter.   Meds ordered this encounter  Medications   atorvastatin (LIPITOR) 80  MG tablet    Sig: Take 1 tablet (80 mg total) by mouth daily.    Dispense:  90 tablet    Refill:  3   losartan (COZAAR) 25 MG tablet    Sig: Take 1 tablet (25 mg total) by mouth daily.    Dispense:  90 tablet    Refill:  3     Patient Instructions  Medication Instructions:  No Changes In Medications at this time.  *If you need a refill on your cardiac medications before your next appointment, please call your pharmacy*  Follow-Up: At [facility], you and your health needs are our priority.  As part of our continuing mission to provide you with exceptional heart care, we have created designated Provider Care Teams.  These Care Teams include your primary Cardiologist (physician) and Advanced Practice Providers (APPs -  Physician Assistants and Nurse Practitioners) who all work together to provide you with the care you need, when you need it.  We recommend signing up for the patient portal called "MyChart".  Sign up information is provided on this After Visit Summary.  MyChart is used to connect with patients for Virtual Visits (Telemedicine).  Patients are able to view lab/test results, encounter notes, upcoming appointments, etc.  Non-urgent messages can be sent to your provider as well.   To learn more about what you can do with MyChart, go to [email or web address].    Your next appointment:   6 month(s)  The format for your next appointment:   In Person  Provider:   Weston Brass, MD    Sherin Quarry as a scribe for Parke Poisson, MD.,have documented all relevant documentation on the behalf of Parke Poisson, MD,as directed by  Parke Poisson, MD while in the presence of Parke Poisson, MD.   I, Parke Poisson, MD, have reviewed all documentation for this visit. The documentation on today's date of service for the exam, diagnosis, procedures, and orders are all accurate and complete.

## 2021-10-19 ENCOUNTER — Ambulatory Visit
Admission: RE | Admit: 2021-10-19 | Discharge: 2021-10-19 | Disposition: A | Discharge status: Home / Self Care | Payer: BC Managed Care – PPO | Source: Ambulatory Visit | Attending: Endocrinology | Admitting: Endocrinology

## 2021-10-19 ENCOUNTER — Other Ambulatory Visit: Payer: Self-pay | Admitting: Endocrinology

## 2021-10-19 DIAGNOSIS — K7689 Other specified diseases of liver: Secondary | ICD-10-CM | POA: Diagnosis not present

## 2021-10-19 DIAGNOSIS — K5790 Diverticulosis of intestine, part unspecified, without perforation or abscess without bleeding: Secondary | ICD-10-CM | POA: Diagnosis not present

## 2021-10-19 DIAGNOSIS — R1030 Lower abdominal pain, unspecified: Secondary | ICD-10-CM | POA: Diagnosis not present

## 2021-10-19 DIAGNOSIS — N281 Cyst of kidney, acquired: Secondary | ICD-10-CM | POA: Diagnosis not present

## 2021-10-19 DIAGNOSIS — K6389 Other specified diseases of intestine: Secondary | ICD-10-CM | POA: Diagnosis not present

## 2021-10-19 DIAGNOSIS — K3189 Other diseases of stomach and duodenum: Secondary | ICD-10-CM | POA: Diagnosis not present

## 2021-10-19 MED ORDER — IOPAMIDOL (ISOVUE-300) INJECTION 61%
100.0000 mL | Freq: Once | INTRAVENOUS | Status: AC | PRN
  Administered 2021-10-19: 100 mL via INTRAVENOUS

## 2021-10-19 MED ORDER — IOPAMIDOL (ISOVUE-300) INJECTION 61%: 100.0000 mL | Freq: Once | INTRAVENOUS | Status: DC | PRN

## 2021-11-15 DIAGNOSIS — K660 Peritoneal adhesions (postprocedural) (postinfection): Secondary | ICD-10-CM | POA: Diagnosis not present

## 2021-11-15 DIAGNOSIS — Z8719 Personal history of other diseases of the digestive system: Secondary | ICD-10-CM | POA: Diagnosis not present

## 2022-03-23 NOTE — Progress Notes (Deleted)
Cardiology Clinic Note   Patient Name: Christopher Guerrero Date of Encounter: 03/23/2022  Primary Care Provider:  Adrian Prince, MD Primary Cardiologist:  Charlton Haws, MD  Patient Profile    Christopher Guerrero 55 year old male presents the clinic today for follow-up evaluation of his hypertension and coronary artery disease.  Past Medical History    Past Medical History:  Diagnosis Date   CAD in native artery 06/15/2021   Diverticulitis    GERD (gastroesophageal reflux disease)    Gout    HLD (hyperlipidemia) 06/15/2021   S/P angioplasty with stent 06/14/21 DES to RCA 06/15/2021   Past Surgical History:  Procedure Laterality Date   ABDOMINAL DEBRIDEMENT  2009   APPENDECTOMY  2008   COLECTOMY  2007   sigmoid colon resection and colostomy   COLON SURGERY  2008   colostomy closure   COLON SURGERY  2008   resection of enterocutaneous fistula, small bowel resection, L colon resection with primary anastomosis, abd wall resection   CORONARY STENT INTERVENTION N/A 06/14/2021   Procedure: CORONARY STENT INTERVENTION;  Surgeon: Corky Crafts, MD;  Location: [facility] INVASIVE CV LAB;  Service: Cardiovascular;  Laterality: N/A;   INTRAVASCULAR ULTRASOUND/IVUS N/A 06/14/2021   Procedure: Intravascular Ultrasound/IVUS;  Surgeon: Corky Crafts, MD;  Location: [facility] INVASIVE CV LAB;  Service: Cardiovascular;  Laterality: N/A;   LEFT HEART CATH AND CORONARY ANGIOGRAPHY N/A 06/14/2021   Procedure: LEFT HEART CATH AND CORONARY ANGIOGRAPHY;  Surgeon: Corky Crafts, MD;  Location: [facility] INVASIVE CV LAB;  Service: Cardiovascular;  Laterality: N/A;    Allergies  Allergies  Allergen Reactions   Hydrocodone Hives    History of Present Illness    Christopher Guerrero is a PMH of coronary artery disease, HTN, GERD, HLD, diverticulitis, gout, and cervical lymphadenopathy.  He had a STEMI with PCI and DES to his RCA 06/14/2021.  His PMH also includes remote tobacco abuse.  He was seen by Dr. Jacques Navy  09/23/2021.  His lab work and echocardiogram were reviewed.  His echocardiogram showed a mild aortic dilation of 41 mm.  His EF had recovered to 53%.  He reported compliance with his atorvastatin.  He reported chest tightness while on Brilinta.  After transitioning off of Brilinta and his chest tightness resolved.  He had returned to work which requires him to lift heavy objects.  He was advised due to his dilated aorta not to lift or carry heavy objects.  He plan to use his home gym for exercise.  He reported more awareness with salt consumption and his wife was helping him follow a low-sodium diet.  He denied sublingual nitroglycerin use.  He denied chest pain, pressure, dyspnea, PND, orthopnea, lower extremity swelling, dizziness and lightheadedness.  He presents to clinic today for follow-up evaluation states***  *** denies chest pain, shortness of breath, lower extremity edema, fatigue, palpitations, melena, hematuria, hemoptysis, diaphoresis, weakness, presyncope, syncope, orthopnea, and PND.  Home Medications    Prior to Admission medications   Medication Sig Start Date End Date Taking? Authorizing Provider  acetaminophen (TYLENOL) 325 MG tablet Take 2 tablets (650 mg total) by mouth every 4 (four) hours as needed for headache or mild pain. 06/15/21   Leone Brand, NP  aspirin 81 MG chewable tablet Chew 1 tablet (81 mg total) by mouth daily. 06/16/21   Leone Brand, NP  atorvastatin (LIPITOR) 80 MG tablet Take 1 tablet (80 mg total) by mouth daily. 09/23/21   Parke Poisson, MD  clopidogrel (PLAVIX) 75 MG tablet Take 1 tablet (75 mg total) by mouth daily. START THIS ON September 30th. TAKE (1)  TABLET DAILY. DO NOT DISCONTINUE WITH OUT CARDIOLOGY APPROVAL 08/06/21   Parke Poisson, MD  Cyanocobalamin (VITAMIN B-12 PO) Take 500 mg by mouth daily.    [provider]  losartan (COZAAR) 25 MG tablet Take 1 tablet (25 mg total) by mouth daily. 09/23/21   Parke Poisson, MD   nitroGLYCERIN (NITROSTAT) 0.4 MG SL tablet Place 1 tablet (0.4 mg total) under the tongue every 5 (five) minutes x 3 doses as needed for chest pain. 06/15/21   Leone Brand, NP  pantoprazole (PROTONIX) 40 MG tablet Take 1 tablet by mouth daily. 05/25/17   [provider]    Family History    Family History  Problem Relation Age of Onset   Arthritis Other    Hypertension Other    Coronary artery disease Other    He indicated that his mother is deceased. He indicated that his father is deceased. He indicated that his maternal grandmother is deceased. He indicated that his maternal grandfather is deceased. He indicated that his paternal grandmother is deceased. He indicated that his paternal grandfather is deceased. He indicated that the status of his other is unknown.  Social History    Social History   Socioeconomic History   Marital status: Married    Spouse name: Not on file   Number of children: Not on file   Years of education: Not on file   Highest education level: Not on file  Occupational History   Not on file  Tobacco Use   Smoking status: Some Days    Packs/day: 20.00    Years: 20.00    Pack years: 400.00    Types: Cigarettes, Cigars   Smokeless tobacco: Never  Substance and Sexual Activity   Alcohol use: Yes   Drug use: No   Sexual activity: Yes  Other Topics Concern   Not on file  Social History Narrative   Not on file   Social Determinants of Health   Financial Resource Strain: Not on file  Food Insecurity: Not on file  Transportation Needs: Not on file  Physical Activity: Not on file  Stress: Not on file  Social Connections: Not on file  Intimate Partner Violence: Not on file     Review of Systems    General:  No chills, fever, night sweats or weight changes.  Cardiovascular:  No chest pain, dyspnea on exertion, edema, orthopnea, palpitations, paroxysmal nocturnal dyspnea. Dermatological: No rash, lesions/masses Respiratory: No cough,  dyspnea Urologic: No hematuria, dysuria Abdominal:   No nausea, vomiting, diarrhea, bright red blood per rectum, melena, or hematemesis Neurologic:  No visual changes, wkns, changes in mental status. All other systems reviewed and are otherwise negative except as noted above.  Physical Exam    VS:  There were no vitals taken for this visit. , BMI There is no height or weight on file to calculate BMI. GEN: Well nourished, well developed, in no acute distress. HEENT: normal. Neck: Supple, no JVD, carotid bruits, or masses. Cardiac: RRR, no murmurs, rubs, or gallops. No clubbing, cyanosis, edema.  Radials/DP/PT 2+ and equal bilaterally.  Respiratory:  Respirations regular and unlabored, clear to auscultation bilaterally. GI: Soft, nontender, nondistended, BS + x 4. MS: no deformity or atrophy. Skin: warm and dry, no rash. Neuro:  Strength and sensation are intact. Psych: Normal affect.  Accessory Clinical Findings  Recent Labs: 06/14/2021: B Natriuretic Peptide 18.9; Magnesium 2.5 06/15/2021: Hemoglobin 13.1; Platelets 154 09/08/2021: BUN 10; Creatinine, Ser 1.07; Potassium 4.4; Sodium 140   Recent Lipid Panel    Component Value Date/Time   CHOL 84 (L) 09/08/2021 0844   TRIG 70 09/08/2021 0844   HDL 37 (L) 09/08/2021 0844   CHOLHDL 2.3 09/08/2021 0844   CHOLHDL 4.9 06/14/2021 0215   VLDL 15 06/14/2021 0215   LDLCALC 32 09/08/2021 0844    ECG personally reviewed by me today- *** - No acute changes  Echocardiogram 09/13/2021 IMPRESSIONS     1. Left ventricular ejection fraction by 3D volume is 53 %. The left  ventricle has normal function. The left ventricle has no regional wall  motion abnormalities. Left ventricular diastolic parameters were normal.   2. Right ventricular systolic function is normal. The right ventricular  size is normal. Tricuspid regurgitation signal is inadequate for assessing  PA pressure.   3. The mitral valve is normal in structure. Trivial  mitral valve  regurgitation. No evidence of mitral stenosis.   4. The aortic valve is tricuspid. Aortic valve regurgitation is trivial.  No aortic stenosis is present.   5. Aortic dilatation noted. There is mild dilatation of the aortic root,  measuring 41 mm.   6. The inferior vena cava is normal in size with greater than 50%  respiratory variability, suggesting right atrial pressure of 3 mmHg.   Cardiac catheterization 06/14/2021     Mid RCA lesion is 95% stenosed.   A drug-eluting stent was successfully placed using a STENT ONYX FRONTIER 4.5X22.  Stent was optimized with intravascular ultrasound.   Post intervention, there is a 0% residual stenosis.   Dist RCA lesion is 25% stenosed.   Mid LAD lesion is 25% stenosed.   Mid Cx lesion is 40% stenosed.  Ectasia noted in the mid vessel which causes some swirling of contrast.   The left ventricular systolic function is normal.   LV end diastolic pressure is normal.   The left ventricular ejection fraction is 50-55% by visual estimate.   There is no aortic valve stenosis.   Continue aspirin and Brilinta for 12 months along with aggressive secondary prevention.    Diagnostic Dominance: Right Intervention     I discussed the results with his wife.  She asked about their upcoming trip to [place].  If his wrist heals well and he does well walking tomorrow, I suspect he will be ok to travel.  I stressed the importance of DAPT to prevent stent thrombosis.  Assessment & Plan   1.  Coronary artery disease-no recent episodes of arm neck back or chest discomfort.  NSTEMI/underwent cardiac catheterization 05/29/2021 noted to have 95% stenosed mid RCA.  He received PCI with DES x1.  He was also noted to have distal RCA lesion 25%, mid LAD lesion 25%, and circumflex lesion 40%.  He noted chest tightness with Brilinta and was transitioned to Plavix. Continue aspirin, atorvastatin, Plavix, losartan, nitroglycerin as needed Heart healthy low-sodium  diet-salty 6 given Increase physical activity as tolerated  Hyperlipidemia-06/14/2021: VLDL 15 09/08/2021: Cholesterol, Total 84; HDL 37; LDL Chol Calc (NIH) 32; Triglycerides 70 Continue aspirin, atorvastatin Heart healthy low-sodium high-fiber diet Increase physical activity as tolerated Repeat fasting lipids and LFTs 7/23  Essential hypertension-BP today***.  Well-controlled. Continue losartan Heart healthy low-sodium diet-salty 6 given Increase physical activity as tolerated  GERD-well-controlled on Protonix GERD diet instructions given Follows with PCP  Disposition: Follow-up with Dr.Acharya or me in  6 months.  Thomasene RippleJesse M. Colan Laymon NP-C    03/23/2022, 4:06 PM [facility] [address] Office [phone] Fax [phone]  Notice: This dictation was prepared with Dragon dictation along with smaller phrase technology. Any transcriptional errors that result from this process are unintentional and may not be corrected upon review.  I spent***minutes examining this patient, reviewing medications, and using patient centered shared decision making involving her cardiac care.  Prior to her visit I spent greater than 20 minutes reviewing her past medical history,  medications, and prior cardiac tests.

## 2022-03-24 ENCOUNTER — Ambulatory Visit: Payer: BC Managed Care – PPO | Admitting: General Practice

## 2022-03-29 ENCOUNTER — Encounter: Payer: Self-pay | Admitting: General Practice

## 2022-05-12 NOTE — Progress Notes (Unsigned)
Cardiology Clinic Note   Patient Name: Christopher Guerrero Date of Encounter: 05/17/2022  Primary Care Provider:  Adrian Prince, MD Primary Cardiologist:  Parke Poisson, MD  Patient Profile    Christopher Guerrero 56 year old male presents the clinic today for follow-up evaluation of his hypertension and coronary artery disease.  Past Medical History    Past Medical History:  Diagnosis Date   CAD in native artery 06/15/2021   Diverticulitis    GERD (gastroesophageal reflux disease)    Gout    HLD (hyperlipidemia) 06/15/2021   S/P angioplasty with stent 06/14/21 DES to RCA 06/15/2021   Past Surgical History:  Procedure Laterality Date   ABDOMINAL DEBRIDEMENT  2009   APPENDECTOMY  2008   COLECTOMY  2007   sigmoid colon resection and colostomy   COLON SURGERY  2008   colostomy closure   COLON SURGERY  2008   resection of enterocutaneous fistula, small bowel resection, L colon resection with primary anastomosis, abd wall resection   CORONARY STENT INTERVENTION N/A 06/14/2021   Procedure: CORONARY STENT INTERVENTION;  Surgeon: Corky Crafts, MD;  Location: [facility] INVASIVE CV LAB;  Service: Cardiovascular;  Laterality: N/A;   INTRAVASCULAR ULTRASOUND/IVUS N/A 06/14/2021   Procedure: Intravascular Ultrasound/IVUS;  Surgeon: Corky Crafts, MD;  Location: [facility] INVASIVE CV LAB;  Service: Cardiovascular;  Laterality: N/A;   LEFT HEART CATH AND CORONARY ANGIOGRAPHY N/A 06/14/2021   Procedure: LEFT HEART CATH AND CORONARY ANGIOGRAPHY;  Surgeon: Corky Crafts, MD;  Location: [facility] INVASIVE CV LAB;  Service: Cardiovascular;  Laterality: N/A;    Allergies  Allergies  Allergen Reactions   Hydrocodone Hives    History of Present Illness    Christopher Guerrero is a PMH of coronary artery disease, HTN, GERD, HLD, diverticulitis, gout, and cervical lymphadenopathy.  He had a STEMI with PCI and DES to his RCA 06/14/2021.  His PMH also includes remote tobacco abuse.   He was seen by Dr.  Jacques Navy 09/23/2021.  His lab work and echocardiogram were reviewed.  His echocardiogram showed a mild aortic dilation of 41 mm.  His EF had recovered to 53%.  He reported compliance with his atorvastatin.  He reported chest tightness while on Brilinta.  After transitioning off of Brilinta and his chest tightness resolved.  He had returned to work which requires him to lift heavy objects.  He was advised due to his dilated aorta not to lift or carry heavy objects.  He plan to use his home gym for exercise.  He reported more awareness with salt consumption and his wife was helping him follow a low-sodium diet.  He denied sublingual nitroglycerin use.  He denied chest pain, pressure, dyspnea, PND, orthopnea, lower extremity swelling, dizziness and lightheadedness.   He presents to clinic today for follow-up evaluation states he feels fairly well.  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  We reviewed his angiography and echocardiogram he expressed understanding.  He does note some joint aches that have been more prominent over the last year.  We reviewed his medications.  He is concerned that his medication may be causing his joint aches.  We discussed the possibility of it being related to his statin therapy.  He continues to be physically active walking 3 miles 4 days/week.  He also goes to the gym.  He will have lab work drawn with his PCP in the next couple weeks and have labs sent to [place].  At the end  of July he may stop Plavix.  I will also have him take a statin vacation to see if he has not change in symptoms.  We will plan follow-up in 6 months and give him the heart healthy diet sheet.   Today he denies chest pain, shortness of breath, lower extremity edema, fatigue, palpitations, melena, hematuria, hemoptysis, diaphoresis, weakness, presyncope, syncope, orthopnea, and PND.  Home Medications    Prior to Admission medications   Medication Sig Start Date End Date Taking?  Authorizing Provider  acetaminophen (TYLENOL) 325 MG tablet Take 2 tablets (650 mg total) by mouth every 4 (four) hours as needed for headache or mild pain. 06/15/21   Leone Brand, NP  aspirin 81 MG chewable tablet Chew 1 tablet (81 mg total) by mouth daily. 06/16/21   Leone Brand, NP  atorvastatin (LIPITOR) 80 MG tablet Take 1 tablet (80 mg total) by mouth daily. 09/23/21   Parke Poisson, MD  clopidogrel (PLAVIX) 75 MG tablet Take 1 tablet (75 mg total) by mouth daily. START THIS ON September 30th. TAKE (1) 75mg  TABLET DAILY. DO NOT DISCONTINUE WITH OUT CARDIOLOGY APPROVAL 08/06/21   10/06/21, MD  Cyanocobalamin (VITAMIN B-12 PO) Take 500 mg by mouth daily.    [provider]  losartan (COZAAR) 25 MG tablet Take 1 tablet (25 mg total) by mouth daily. 09/23/21   09/25/21, MD  nitroGLYCERIN (NITROSTAT) 0.4 MG SL tablet Place 1 tablet (0.4 mg total) under the tongue every 5 (five) minutes x 3 doses as needed for chest pain. 06/15/21   06/17/21, NP  pantoprazole (PROTONIX) 40 MG tablet Take 1 tablet by mouth daily. 05/25/17   [provider]    Family History    Family History  Problem Relation Age of Onset   Arthritis Other    Hypertension Other    Coronary artery disease Other    He indicated that his mother is deceased. He indicated that his father is deceased. He indicated that his maternal grandmother is deceased. He indicated that his maternal grandfather is deceased. He indicated that his paternal grandmother is deceased. He indicated that his paternal grandfather is deceased. He indicated that the status of his other is unknown.  Social History    Social History   Socioeconomic History   Marital status: Married    Spouse name: Not on file   Number of children: Not on file   Years of education: Not on file   Highest education level: Not on file  Occupational History   Not on file  Tobacco Use   Smoking status: Some Days     Packs/day: 20.00    Years: 20.00    Total pack years: 400.00    Types: Cigarettes, Cigars   Smokeless tobacco: Never  Substance and Sexual Activity   Alcohol use: Yes   Drug use: No   Sexual activity: Yes  Other Topics Concern   Not on file  Social History Narrative   Not on file   Social Determinants of Health   Financial Resource Strain: Not on file  Food Insecurity: Not on file  Transportation Needs: Not on file  Physical Activity: Not on file  Stress: Not on file  Social Connections: Not on file  Intimate Partner Violence: Not on file     Review of Systems    General:  No chills, fever, night sweats or weight changes.  Cardiovascular:  No chest pain, dyspnea on exertion, edema, orthopnea,  palpitations, paroxysmal nocturnal dyspnea. Dermatological: No rash, lesions/masses Respiratory: No cough, dyspnea Urologic: No hematuria, dysuria Abdominal:   No nausea, vomiting, diarrhea, bright red blood per rectum, melena, or hematemesis Neurologic:  No visual changes, wkns, changes in mental status. All other systems reviewed and are otherwise negative except as noted above.  Physical Exam    VS:  BP 130/82   Pulse 76   Ht 5\' 11"  (1.803 m)   Wt 196 lb 3.2 oz (89 kg)   SpO2 98%   BMI 27.36 kg/m  , BMI Body mass index is 27.36 kg/m. GEN: Well nourished, well developed, in no acute distress. HEENT: normal. Neck: Supple, no JVD, carotid bruits, or masses. Cardiac: RRR, no murmurs, rubs, or gallops. No clubbing, cyanosis, edema.  Radials/DP/PT 2+ and equal bilaterally.  Respiratory:  Respirations regular and unlabored, clear to auscultation bilaterally. GI: Soft, nontender, nondistended, BS + x 4. MS: no deformity or atrophy. Skin: warm and dry, no rash. Neuro:  Strength and sensation are intact. Psych: Normal affect.  Accessory Clinical Findings    Recent Labs: 06/14/2021: B Natriuretic Peptide 18.9; Magnesium 2.5 06/15/2021: Hemoglobin 13.1; Platelets  154 09/08/2021: BUN 10; Creatinine, Ser 1.07; Potassium 4.4; Sodium 140   Recent Lipid Panel    Component Value Date/Time   CHOL 84 (L) 09/08/2021 0844   TRIG 70 09/08/2021 0844   HDL 37 (L) 09/08/2021 0844   CHOLHDL 2.3 09/08/2021 0844   CHOLHDL 4.9 06/14/2021 0215   VLDL 15 06/14/2021 0215   LDLCALC 32 09/08/2021 0844    ECG personally reviewed by me today-none today.  Echocardiogram 09/13/2021 IMPRESSIONS     1. Left ventricular ejection fraction by 3D volume is 53 %. The left  ventricle has normal function. The left ventricle has no regional wall  motion abnormalities. Left ventricular diastolic parameters were normal.   2. Right ventricular systolic function is normal. The right ventricular  size is normal. Tricuspid regurgitation signal is inadequate for assessing  PA pressure.   3. The mitral valve is normal in structure. Trivial mitral valve  regurgitation. No evidence of mitral stenosis.   4. The aortic valve is tricuspid. Aortic valve regurgitation is trivial.  No aortic stenosis is present.   5. Aortic dilatation noted. There is mild dilatation of the aortic root,  measuring 41 mm.   6. The inferior vena cava is normal in size with greater than 50%  respiratory variability, suggesting right atrial pressure of 3 mmHg.     Cardiac catheterization 06/14/2021      Mid RCA lesion is 95% stenosed.   A drug-eluting stent was successfully placed using a STENT ONYX FRONTIER 4.5X22.  Stent was optimized with intravascular ultrasound.   Post intervention, there is a 0% residual stenosis.   Dist RCA lesion is 25% stenosed.   Mid LAD lesion is 25% stenosed.   Mid Cx lesion is 40% stenosed.  Ectasia noted in the mid vessel which causes some swirling of contrast.   The left ventricular systolic function is normal.   LV end diastolic pressure is normal.   The left ventricular ejection fraction is 50-55% by visual estimate.   There is no aortic valve stenosis.   Continue  aspirin and Brilinta for 12 months along with aggressive secondary prevention.     Diagnostic Dominance: Right Intervention      I discussed the results with his wife.  She asked about their upcoming trip to [place].  If his wrist heals well and he does  well walking tomorrow, I suspect he will be ok to travel.  I stressed the importance of DAPT to prevent stent thrombosis.  Assessment & Plan   1.  Coronary artery disease-no recent episodes of arm neck back or chest discomfort.  NSTEMI/underwent cardiac catheterization 05/29/2021 noted to have 95% stenosed mid RCA.  He received PCI with DES x1.  He was also noted to have distal RCA lesion 25%, mid LAD lesion 25%, and circumflex lesion 40%.  He noted chest tightness with Brilinta and was transitioned to Plavix. Continue aspirin, atorvastatin,  losartan, nitroglycerin as needed Heart healthy low-sodium diet-salty 6 given Maintain physical activity as tolerated May stop Plavix at the end of July  Hyperlipidemia-06/14/2021: VLDL 15 09/08/2021: Cholesterol, Total 84; HDL 37; LDL Chol Calc (NIH) 32; Triglycerides 70 Continue aspirin, atorvastatin Heart healthy low-sodium high-fiber diet Increase physical activity as tolerated Repeat fasting lipids and LFTs 7/23-follows with PCP , will send labs to cardiology Feels that he does have some joint aches that have been present for around a year.  We discussed him taking a statin vacation at the end of July for 2 weeks.  Instructed to resume if no change in symptoms.  If he does have relief we will talk about changing statin therapy.  He expressed understanding.  Essential hypertension-BP today 130/82.  Well-controlled. Continue losartan Heart healthy low-sodium diet Increase physical activity as tolerated   GERD-well-controlled on Protonix GERD diet instructions given Follows with PCP   Disposition: Follow-up with Dr. Margaretann Loveless or me in 6 months.   Christopher Guerrero. Karleigh Bunte NP-C    05/17/2022, 4:16  PM [facility] [address] Office [phone] Fax [phone]  Notice: This dictation was prepared with Dragon dictation along with smaller phrase technology. Any transcriptional errors that result from this process are unintentional and may not be corrected upon review.  I spent 15 minutes examining this patient, reviewing medications, and using patient centered shared decision making involving her cardiac care.  Prior to her visit I spent greater than 20 minutes reviewing her past medical history,  medications, and prior cardiac tests.

## 2022-05-17 ENCOUNTER — Ambulatory Visit: Payer: BC Managed Care – PPO | Admitting: General Practice

## 2022-05-17 ENCOUNTER — Encounter: Payer: Self-pay | Admitting: General Practice

## 2022-05-17 VITALS — BP 130/82 | HR 76 | Ht 71.0 in | Wt 196.2 lb

## 2022-05-17 DIAGNOSIS — I2510 Atherosclerotic heart disease of native coronary artery without angina pectoris: Secondary | ICD-10-CM

## 2022-05-17 DIAGNOSIS — K219 Gastro-esophageal reflux disease without esophagitis: Secondary | ICD-10-CM | POA: Diagnosis not present

## 2022-05-17 DIAGNOSIS — E7800 Pure hypercholesterolemia, unspecified: Secondary | ICD-10-CM | POA: Diagnosis not present

## 2022-05-17 DIAGNOSIS — I10 Essential (primary) hypertension: Secondary | ICD-10-CM

## 2022-05-17 MED ORDER — NITROGLYCERIN 0.4 MG SL SUBL: 0.4000 mg | SUBLINGUAL_TABLET | SUBLINGUAL | 4 refills | Status: DC | PRN

## 2022-05-17 NOTE — Patient Instructions (Addendum)
Medication Instructions:  MAY STOP PLAVIX AT THE END OF JULY 2023  *If you need a refill on your cardiac medications before your next appointment, please call your pharmacy*  Lab Work:    HAVE PRIMARY FAX [place] LAB RESULTS [phone]  If you have labs (blood work) drawn today and your tests are completely normal, you will receive your results only by: MyChart Message (if you have MyChart) OR  A paper copy in the mail If you have any lab test that is abnormal or we need to change your treatment, we will call you to review the results.  Special Instructions MAY STOP STATIN FOR 2 WEEKS IF NO CHANGE IN SYMPTOMS RESTART AND LET us KNOW-CALL AND LET us KNOW  PLEASE READ AND FOLLOW INCREASED FIBER DIET-ATTACHED  Follow-Up: Your next appointment:  6 month(s) In Person with Parke Poisson, MD     At [facility], you and your health needs are our priority.  As part of our continuing mission to provide you with exceptional heart care, we have created designated Provider Care Teams.  These Care Teams include your primary Cardiologist (physician) and Advanced Practice Providers (APPs -  Physician Assistants and Nurse Practitioners) who all work together to provide you with the care you need, when you need it.  We recommend signing up for the patient portal called "MyChart".  Sign up information is provided on this After Visit Summary.  MyChart is used to connect with patients for Virtual Visits (Telemedicine).  Patients are able to view lab/test results, encounter notes, upcoming appointments, etc.  Non-urgent messages can be sent to your provider as well.   To learn more about what you can do with MyChart, go to [email or web address].     Important Information About Sugar       High-Fiber Eating Plan Fiber, also called dietary fiber, is a type of carbohydrate. It is found foods such as fruits, vegetables, whole grains, and beans. A high-fiber diet can have many health benefits. Your  health care provider may recommend a high-fiber diet to help: Prevent constipation. Fiber can make your bowel movements more regular. Lower your cholesterol. Relieve the following conditions: Inflammation of veins in the anus (hemorrhoids). Inflammation of specific areas of the digestive tract (uncomplicated diverticulosis). A problem of the large intestine, also called the colon, that sometimes causes pain and diarrhea (irritable bowel syndrome, or IBS). Prevent overeating as part of a weight-loss plan. Prevent heart disease, type 2 diabetes, and certain cancers. What are tips for following this plan? Reading food labels  Check the nutrition facts label on food products for the amount of dietary fiber. Choose foods that have 5 grams of fiber or more per serving. The goals for recommended daily fiber intake include: Men (age 25 or younger): 34-38 g. Men (over age 6): 28-34 g. Women (age 97 or younger): 25-28 g. Women (over age 54): 22-25 g. Your daily fiber goal is _____________ g. Shopping Choose whole fruits and vegetables instead of processed forms, such as apple juice or applesauce. Choose a wide variety of high-fiber foods such as avocados, lentils, oats, and kidney beans. Read the nutrition facts label of the foods you choose. Be aware of foods with added fiber. These foods often have high sugar and sodium amounts per serving. Cooking Use whole-grain flour for baking and cooking. Cook with brown rice instead of white rice. Meal planning Start the day with a breakfast that is high in fiber, such as a cereal that contains 5  g of fiber or more per serving. Eat breads and cereals that are made with whole-grain flour instead of refined flour or white flour. Eat brown rice, bulgur wheat, or millet instead of white rice. Use beans in place of meat in soups, salads, and pasta dishes. Be sure that half of the grains you eat each day are whole grains. General information You can get the  recommended daily intake of dietary fiber by: Eating a variety of fruits, vegetables, grains, nuts, and beans. Taking a fiber supplement if you are not able to take in enough fiber in your diet. It is better to get fiber through food than from a supplement. Gradually increase how much fiber you consume. If you increase your intake of dietary fiber too quickly, you may have bloating, cramping, or gas. Drink plenty of water to help you digest fiber. Choose high-fiber snacks, such as berries, raw vegetables, nuts, and popcorn. What foods should I eat? Fruits Berries. Pears. Apples. Oranges. Avocado. Prunes and raisins. Dried figs. Vegetables Sweet potatoes. Spinach. Kale. Artichokes. Cabbage. Broccoli. Cauliflower. Green peas. Carrots. Squash. Grains Whole-grain breads. Multigrain cereal. Oats and oatmeal. Brown rice. Barley. Bulgur wheat. Millet. Quinoa. Bran muffins. Popcorn. Rye wafer crackers. Meats and other proteins Navy beans, kidney beans, and pinto beans. Soybeans. Split peas. Lentils. Nuts and seeds. Dairy Fiber-fortified yogurt. Beverages Fiber-fortified soy milk. Fiber-fortified orange juice. Other foods Fiber bars. The items listed above may not be a complete list of recommended foods and beverages. Contact a dietitian for more information. What foods should I avoid? Fruits Fruit juice. Cooked, strained fruit. Vegetables Fried potatoes. Canned vegetables. Well-cooked vegetables. Grains White bread. Pasta made with refined flour. White rice. Meats and other proteins Fatty cuts of meat. Fried chicken or fried fish. Dairy Milk. Yogurt. Cream cheese. Sour cream. Fats and oils Butters. Beverages Soft drinks. Other foods Cakes and pastries. The items listed above may not be a complete list of foods and beverages to avoid. Talk with your dietitian about what choices are best for you. Summary Fiber is a type of carbohydrate. It is found in foods such as fruits, vegetables,  whole grains, and beans. A high-fiber diet has many benefits. It can help to prevent constipation, lower blood cholesterol, aid weight loss, and reduce your risk of heart disease, diabetes, and certain cancers. Increase your intake of fiber gradually. Increasing fiber too quickly may cause cramping, bloating, and gas. Drink plenty of water while you increase the amount of fiber you consume. The best sources of fiber include whole fruits and vegetables, whole grains, nuts, seeds, and beans. This information is not intended to replace advice given to you by your health care provider. Make sure you discuss any questions you have with your health care provider. Document Revised: 03/19/2020 Document Reviewed: 03/19/2020 Elsevier Patient Education  2023 [employer].

## 2022-06-16 ENCOUNTER — Telehealth: Payer: Self-pay | Admitting: Internal Medicine

## 2022-06-16 NOTE — Telephone Encounter (Signed)
Wife is calling in regarding patient medication. Please advise

## 2022-06-16 NOTE — Telephone Encounter (Signed)
Spoke to patient's wife she stated she was calling to verify when husband is suppose to stop Plavix and stop Atorvastatin.Advised when husband saw Edd Fabian NP 6/20 he advised to stop Plavix the end of July.Advised to hold Atorvastatin for 3 weeks to see if joint pain improves.Advised to call office in 3 weeks to report symptoms.I will send message to Dr.Acharya's RN to make her aware.

## 2022-07-15 ENCOUNTER — Telehealth: Payer: Self-pay | Admitting: *Deleted

## 2022-07-15 NOTE — Chronic Care Management (AMB) (Signed)
  Care Coordination   Note   07/15/2022 Name: Christopher Guerrero MRN: [id number] DOB: [birth year]  Christopher Guerrero is a 56 y.o. year old male who sees [place], Jeannett Senior, MD for primary care. I reached out to [name] by phone today to offer care coordination services.  [name] was given information about Care Coordination services today including:   The Care Coordination services include support from the care team which includes your Nurse Coordinator, Clinical Social Worker, or Pharmacist.  The Care Coordination team is here to help remove barriers to the health concerns and goals most important to you. Care Coordination services are voluntary, and the patient may decline or stop services at any time by request to their care team member.   Care Coordination Consent Status: Patient did not agree to participate in care coordination services at this time.  Encounter Outcome:  Pt. Refused  [facility] Coordination Care Guide  Direct Dial: [phone]

## 2022-07-15 NOTE — Chronic Care Management (AMB) (Signed)
  Care Coordination  Outreach Note  07/15/2022 Name: Christopher Guerrero MRN: [id number] DOB: 1965-12-05   Care Coordination Outreach Attempts  An unsuccessful telephone outreach was attempted today to offer the patient information about available care coordination services as a benefit of their health plan.   Follow Up Plan:  Additional outreach attempts will be made to offer the patient care coordination information and services.   Encounter Outcome:  No Answer   Gwenevere Ghazi  Care Coordination Care Guide  Direct Dial: [phone]

## 2022-08-31 DIAGNOSIS — Z1212 Encounter for screening for malignant neoplasm of rectum: Secondary | ICD-10-CM | POA: Diagnosis not present

## 2022-08-31 DIAGNOSIS — M109 Gout, unspecified: Secondary | ICD-10-CM | POA: Diagnosis not present

## 2022-08-31 DIAGNOSIS — R7301 Impaired fasting glucose: Secondary | ICD-10-CM | POA: Diagnosis not present

## 2022-08-31 DIAGNOSIS — Z125 Encounter for screening for malignant neoplasm of prostate: Secondary | ICD-10-CM | POA: Diagnosis not present

## 2022-08-31 DIAGNOSIS — E538 Deficiency of other specified B group vitamins: Secondary | ICD-10-CM | POA: Diagnosis not present

## 2022-08-31 DIAGNOSIS — E785 Hyperlipidemia, unspecified: Secondary | ICD-10-CM | POA: Diagnosis not present

## 2022-09-01 DIAGNOSIS — Z0000 Encounter for general adult medical examination without abnormal findings: Secondary | ICD-10-CM | POA: Diagnosis not present

## 2022-09-01 DIAGNOSIS — R82998 Other abnormal findings in urine: Secondary | ICD-10-CM | POA: Diagnosis not present

## 2022-09-01 DIAGNOSIS — Z1339 Encounter for screening examination for other mental health and behavioral disorders: Secondary | ICD-10-CM | POA: Diagnosis not present

## 2022-09-01 DIAGNOSIS — I2510 Atherosclerotic heart disease of native coronary artery without angina pectoris: Secondary | ICD-10-CM | POA: Diagnosis not present

## 2022-09-01 DIAGNOSIS — Z23 Encounter for immunization: Secondary | ICD-10-CM | POA: Diagnosis not present

## 2022-09-01 DIAGNOSIS — Z1331 Encounter for screening for depression: Secondary | ICD-10-CM | POA: Diagnosis not present

## 2022-10-21 ENCOUNTER — Other Ambulatory Visit: Payer: Self-pay | Admitting: Internal Medicine

## 2022-10-31 ENCOUNTER — Other Ambulatory Visit: Payer: Self-pay

## 2022-10-31 MED ORDER — ATORVASTATIN CALCIUM 80 MG PO TABS: 80.0000 mg | ORAL_TABLET | Freq: Every day | ORAL | 0 refills | Status: DC

## 2022-11-16 ENCOUNTER — Encounter: Payer: Self-pay | Admitting: Internal Medicine

## 2022-11-16 ENCOUNTER — Ambulatory Visit: Payer: BC Managed Care – PPO | Attending: Internal Medicine | Admitting: Internal Medicine

## 2022-11-16 VITALS — BP 126/80 | HR 66 | Ht 71.0 in | Wt 206.4 lb

## 2022-11-16 DIAGNOSIS — I2510 Atherosclerotic heart disease of native coronary artery without angina pectoris: Secondary | ICD-10-CM

## 2022-11-16 DIAGNOSIS — K219 Gastro-esophageal reflux disease without esophagitis: Secondary | ICD-10-CM

## 2022-11-16 DIAGNOSIS — E7800 Pure hypercholesterolemia, unspecified: Secondary | ICD-10-CM

## 2022-11-16 DIAGNOSIS — Z95820 Peripheral vascular angioplasty status with implants and grafts: Secondary | ICD-10-CM

## 2022-11-16 DIAGNOSIS — I77810 Thoracic aortic ectasia: Secondary | ICD-10-CM

## 2022-11-16 DIAGNOSIS — I214 Non-ST elevation (NSTEMI) myocardial infarction: Secondary | ICD-10-CM

## 2022-11-16 DIAGNOSIS — I10 Essential (primary) hypertension: Secondary | ICD-10-CM

## 2022-11-16 NOTE — Progress Notes (Signed)
Cardiology Office Note:    Date:  11/16/2022   ID:  Christopher Guerrero, DOB [birth year], MRN [id number]  PCP:  Reynold Bowen, MD  Cardiologist:  Elouise Munroe, MD  Electrophysiologist:  None   Referring MD: Reynold Bowen, MD   Chief Complaint/Reason for Referral: NSTEMI, f/u  History of Present Illness:    Christopher Guerrero is a 56 y.o. male with a history of remote 20 pack year smoking history, premature family history of CAD, who presented with NSTEMI in 06/14/21 now s/p PCI to mid RCA.   He was tolerating the atorvastatin. He was also tolerating the change to Plavix and reports not bruising as much. On Brilinta, he was experiencing chest tightness. However, he did not report chest tightness after discontinuing Brilinta.   For work, he often carries large objects. I advised him against trying to lift heavier objects because of his dilated aorta.  He has not required SL NTG.Has been doing well.  He reports that a couple weeks ago he experienced some headaches as well as a warm sensation on his lower left extremity. He experiences this LLE sensation when he is at rest. He states that he does have some neuropathy. Since these experiences a couple weeks ago, he has noticed improvement of both his headaches and LLE warmness.    Additionally, he mentions mild shortness of breath when he walks.   He is drinking caffeine fairly regularly, and also tries to stay hydrated with water.   For exercise, he walks about four nights per week. He also performs a decent amount of activity for his work.   He denies any palpitations, chest pain, or peripheral edema. No lightheadedness, syncope, orthopnea, or PND.  Past Medical History:  Diagnosis Date   CAD in native artery 06/15/2021   Diverticulitis    GERD (gastroesophageal reflux disease)    Gout    HLD (hyperlipidemia) 06/15/2021   S/P angioplasty with stent 06/14/21 DES to RCA 06/15/2021    Past Surgical History:  Procedure Laterality Date    ABDOMINAL DEBRIDEMENT  2009   APPENDECTOMY  2008   COLECTOMY  2007   sigmoid colon resection and colostomy   COLON SURGERY  2008   colostomy closure   COLON SURGERY  2008   resection of enterocutaneous fistula, small bowel resection, L colon resection with primary anastomosis, abd wall resection   CORONARY STENT INTERVENTION N/A 06/14/2021   Procedure: CORONARY STENT INTERVENTION;  Surgeon: Jettie Booze, MD;  Location: [facility];  Service: Cardiovascular;  Laterality: N/A;   INTRAVASCULAR ULTRASOUND/IVUS N/A 06/14/2021   Procedure: Intravascular Ultrasound/IVUS;  Surgeon: Jettie Booze, MD;  Location: [facility];  Service: Cardiovascular;  Laterality: N/A;   LEFT HEART CATH AND CORONARY ANGIOGRAPHY N/A 06/14/2021   Procedure: LEFT HEART CATH AND CORONARY ANGIOGRAPHY;  Surgeon: Jettie Booze, MD;  Location: [facility];  Service: Cardiovascular;  Laterality: N/A;    Current Medications: Current Meds  Medication Sig   acetaminophen (TYLENOL) 325 MG tablet Take 2 tablets (650 mg total) by mouth every 4 (four) hours as needed for headache or mild pain.   aspirin 81 MG chewable tablet Chew 1 tablet (81 mg total) by mouth daily.   atorvastatin (LIPITOR) 80 MG tablet Take 1 tablet (80 mg total) by mouth daily.   Cyanocobalamin (VITAMIN B-12 PO) Take 500 mg by mouth daily.   losartan (COZAAR) 25 MG tablet Take 1 tablet (25 mg total) by mouth daily.  nitroGLYCERIN (NITROSTAT) 0.4 MG SL tablet Place 1 tablet (0.4 mg total) under the tongue every 5 (five) minutes x 3 doses as needed for chest pain.   pantoprazole (PROTONIX) 40 MG tablet Take 1 tablet by mouth daily.   Polyethylene Glycol 3350 (MIRALAX PO) Take by mouth as needed (Constipation).     Allergies:   Hydrocodone   Social History   Tobacco Use   Smoking status: Former    Packs/day: 20.00    Years: 20.00    Total pack years: 400.00    Types: Cigarettes, Cigars   Smokeless tobacco:  Never  Substance Use Topics   Alcohol use: Yes   Drug use: No     Family History: The patient's family history includes Arthritis in an other family member; Coronary artery disease in an other family member; Hypertension in an other family member.  ROS:   Please see the history of present illness.    (+) Intermittent headaches (+) Intermittent LLE warmness  (+) Mild exertional shortness of breath All other systems reviewed and are negative.  EKGs/Labs/Other Studies Reviewed:    The following studies were reviewed today:  Echo 09/13/21 1. Left ventricular ejection fraction by 3D volume is 53 %. The left  ventricle has normal function. The left ventricle has no regional wall  motion abnormalities. Left ventricular diastolic parameters were normal.   2. Right ventricular systolic function is normal. The right ventricular  size is normal. Tricuspid regurgitation signal is inadequate for assessing  PA pressure.   3. The mitral valve is normal in structure. Trivial mitral valve  regurgitation. No evidence of mitral stenosis.   4. The aortic valve is tricuspid. Aortic valve regurgitation is trivial.  No aortic stenosis is present.   5. Aortic dilatation noted. There is mild dilatation of the aortic root,  measuring 41 mm.   6. The inferior vena cava is normal in size with greater than 50%  respiratory variability, suggesting right atrial pressure of 3 mmHg.   Echo 06/14/21 1. Left ventricular ejection fraction, by estimation, is 45 to 50%. The  left ventricle has mildly decreased function. The left ventricle  demonstrates global hypokinesis. There is mild left ventricular  hypertrophy. Left ventricular diastolic parameters  are consistent with Grade I diastolic dysfunction (impaired relaxation).   2. Right ventricular systolic function is normal. The right ventricular  size is normal.   3. The mitral valve is normal in structure. No evidence of mitral valve  regurgitation. No  evidence of mitral stenosis.   4. The aortic valve is normal in structure. Aortic valve regurgitation is  not visualized. No aortic stenosis is present.   5. Aortic dilatation noted. There is mild dilatation of the ascending  aorta, measuring 41 mm.   6. The inferior vena cava is normal in size with greater than 50%  respiratory variability, suggesting right atrial pressure of 3 mmHg.  L Heart Cath 06/14/21  Mid RCA lesion is 95% stenosed.   A drug-eluting stent was successfully placed using a STENT ONYX FRONTIER 4.5X22.  Stent was optimized with intravascular ultrasound.   Post intervention, there is a 0% residual stenosis.   Dist RCA lesion is 25% stenosed.   Mid LAD lesion is 25% stenosed.   Mid Cx lesion is 40% stenosed.  Ectasia noted in the mid vessel which causes some swirling of contrast.   The left ventricular systolic function is normal.   LV end diastolic pressure is normal.   The left ventricular ejection fraction  is 50-55% by visual estimate.   There is no aortic valve stenosis.  EKG:  EKG is personally reviewed. 11/16/22: NSR. 09/23/21: No EKG ordered today 08/06/21: Sinus bradycardia with sinus arrhythmia, rate 58 bpm.   Imaging studies that I have independently reviewed today: n/a  Recent Labs: No results found for requested labs within last 365 days.  Recent Lipid Panel    Component Value Date/Time   CHOL 84 (L) 09/08/2021 0844   TRIG 70 09/08/2021 0844   HDL 37 (L) 09/08/2021 0844   CHOLHDL 2.3 09/08/2021 0844   CHOLHDL 4.9 06/14/2021 0215   VLDL 15 06/14/2021 0215   LDLCALC 32 09/08/2021 0844    Physical Exam:    VS:  BP 126/80   Pulse 66   Ht 5\' 11"  (1.803 m)   Wt 206 lb 6.4 oz (93.6 kg)   SpO2 96%   BMI 28.79 kg/m     Wt Readings from Last 5 Encounters:  11/16/22 206 lb 6.4 oz (93.6 kg)  05/17/22 196 lb 3.2 oz (89 kg)  09/23/21 193 lb (87.5 kg)  08/06/21 199 lb 6.4 oz (90.4 kg)  06/15/21 198 lb 3.1 oz (89.9 kg)    Constitutional: No acute  distress Eyes: sclera non-icteric, normal conjunctiva and lids ENMT: normal dentition, moist mucous membranes Cardiovascular: regular rhythm, normal rate, no murmurs. S1 and S2 normal. No jugular venous distention.  Respiratory: clear to auscultation bilaterally GI : normal bowel sounds, soft and nontender. No distention.   MSK: extremities warm, well perfused. No edema.  NEURO: grossly nonfocal exam, moves all extremities. PSYCH: alert and oriented x 3, normal mood and affect.   ASSESSMENT:    1. NSTEMI (non-ST elevated myocardial infarction) ([place])   2. CAD in native artery   3. Pure hypercholesterolemia   4. Primary hypertension   5. Gastroesophageal reflux disease without esophagitis   6. S/P angioplasty with stent 06/14/21 DES to RCA   7. Mild dilation of ascending aorta ([facility])     PLAN:    NSTEMI (non-ST elevated myocardial infarction) ([place]) -  S/P angioplasty with stent 06/14/21 DES to RCA CAD in native artery  - continue ASA 81 mg daily, completed dual antiplatelet therapy for 1 year without interruption.   - mildly reduced EF on echo however slightly improved on repeat. Continue losartan 25 mg daily. No BB with baseline bradycardia. - repeat echo this year for 1 year follow up of CAD and Asc Ao dilation. - has not required sl nitro.  Pure hypercholesterolemia - continue statin, lipitor 80 mg daily. LDL at goal, 57 (08/31/22)   Primary hypertension - losartan 25 mg daily, continue, BP appropriate.  Mild dilation of ascending aorta - repeat an echo now.  Total time of encounter: 30 minutes total time of encounter, including 20 minutes spent in face-to-face patient care on the date of this encounter. This time includes coordination of care and counseling regarding above mentioned problem list. Remainder of non-face-to-face time involved reviewing chart documents/testing relevant to the patient encounter and documentation in the medical record. I have independently reviewed  documentation from referring provider.   Follow up: 6 months.  Cherlynn Kaiser, MD, [place]   Shared Decision Making/Informed Consent:       Medication Adjustments/Labs and Tests Ordered: Current medicines are reviewed at length with the patient today.  Concerns regarding medicines are outlined above.   Orders Placed This Encounter  Procedures   EKG 12-Lead   ECHOCARDIOGRAM COMPLETE  No orders of the defined types were placed in this encounter.    Patient Instructions  Medication Instructions:  No Changes In Medications at this time.  *If you need a refill on your cardiac medications before your next appointment, please call your pharmacy*  Lab Work: None Ordered At This Time.  If you have labs (blood work) drawn today and your tests are completely normal, you will receive your results only by: MyChart Message (if you have MyChart) OR A paper copy in the mail If you have any lab test that is abnormal or we need to change your treatment, we will call you to review the results.  Testing/Procedures: Your physician has requested that you have an echocardiogram ON 12/26 AT 10AM. Echocardiography is a painless test that uses sound waves to create images of your heart. It provides your doctor with information about the size and shape of your heart and how well your heart's chambers and valves are working. You may receive an ultrasound enhancing agent through an IV if needed to better visualize your heart during the echo.This procedure takes approximately one hour. There are no restrictions for this procedure. This will take place at [facility]   Follow-Up: At [facility], you and your health needs are our priority.  As part of our continuing mission to provide you with exceptional heart care, we have created designated Provider Care Teams.  These Care Teams include your primary Cardiologist (physician) and Advanced Practice Providers  (APPs -  Physician Assistants and Nurse Practitioners) who all work together to provide you with the care you need, when you need it.  Your next appointment:   6 month(s)  The format for your next appointment:   In Person  Provider:   Parke Poisson, MD            I,Breanna Adamick,acting as a scribe for Parke Poisson, MD.,have documented all relevant documentation on the behalf of Parke Poisson, MD,as directed by  Parke Poisson, MD while in the presence of Parke Poisson, MD.  I, Parke Poisson, MD, have reviewed all documentation for the visit on 11/16/2022. The documentation on today's date of service for the exam, diagnosis, procedures, and orders are all accurate and complete.

## 2022-11-16 NOTE — Patient Instructions (Signed)
Medication Instructions:  No Changes In Medications at this time.  *If you need a refill on your cardiac medications before your next appointment, please call your pharmacy*  Lab Work: None Ordered At This Time.  If you have labs (blood work) drawn today and your tests are completely normal, you will receive your results only by: MyChart Message (if you have MyChart) OR A paper copy in the mail If you have any lab test that is abnormal or we need to change your treatment, we will call you to review the results.  Testing/Procedures: Your physician has requested that you have an echocardiogram ON 12/26 AT 10AM. Echocardiography is a painless test that uses sound waves to create images of your heart. It provides your doctor with information about the size and shape of your heart and how well your heart's chambers and valves are working. You may receive an ultrasound enhancing agent through an IV if needed to better visualize your heart during the echo.This procedure takes approximately one hour. There are no restrictions for this procedure. This will take place at [facility]   Follow-Up: At [facility], you and your health needs are our priority.  As part of our continuing mission to provide you with exceptional heart care, we have created designated Provider Care Teams.  These Care Teams include your primary Cardiologist (physician) and Advanced Practice Providers (APPs -  Physician Assistants and Nurse Practitioners) who all work together to provide you with the care you need, when you need it.  Your next appointment:   6 month(s)  The format for your next appointment:   In Person  Provider:   Parke Poisson, MD

## 2022-11-22 ENCOUNTER — Ambulatory Visit (HOSPITAL_COMMUNITY)
Admission: RE | Admit: 2022-11-22 | Discharge: 2022-11-22 | Disposition: A | Discharge status: Home / Self Care | Payer: BC Managed Care – PPO | Source: Ambulatory Visit | Attending: Internal Medicine | Admitting: Internal Medicine

## 2022-11-22 DIAGNOSIS — I351 Nonrheumatic aortic (valve) insufficiency: Secondary | ICD-10-CM | POA: Diagnosis not present

## 2022-11-22 DIAGNOSIS — I214 Non-ST elevation (NSTEMI) myocardial infarction: Secondary | ICD-10-CM | POA: Diagnosis not present

## 2022-11-22 DIAGNOSIS — E785 Hyperlipidemia, unspecified: Secondary | ICD-10-CM | POA: Insufficient documentation

## 2022-11-22 DIAGNOSIS — I10 Essential (primary) hypertension: Secondary | ICD-10-CM | POA: Diagnosis not present

## 2022-11-22 DIAGNOSIS — I2510 Atherosclerotic heart disease of native coronary artery without angina pectoris: Secondary | ICD-10-CM | POA: Insufficient documentation

## 2022-11-22 LAB — ECHOCARDIOGRAM COMPLETE
Area-P 1/2: 2.56 cm2
Calc EF: 53.9 %
S' Lateral: 4.1 cm
Single Plane A2C EF: 51.9 %
Single Plane A4C EF: 57.4 %

## 2022-11-22 NOTE — Progress Notes (Signed)
  Echocardiogram 2D Echocardiogram has been performed.  Christopher Guerrero 11/22/2022, 10:34 AM

## 2022-12-05 ENCOUNTER — Encounter: Payer: Self-pay | Admitting: Internal Medicine

## 2022-12-22 DIAGNOSIS — M549 Dorsalgia, unspecified: Secondary | ICD-10-CM | POA: Diagnosis not present

## 2022-12-25 ENCOUNTER — Other Ambulatory Visit: Payer: Self-pay | Admitting: Internal Medicine

## 2022-12-27 MED ORDER — ATORVASTATIN CALCIUM 80 MG PO TABS: 80.0000 mg | ORAL_TABLET | Freq: Every day | ORAL | 3 refills | Status: DC

## 2022-12-27 NOTE — Telephone Encounter (Signed)
Patient came by office to have medication refilled.  Medication refilled by e-scribed  Patient's wife Lisabeth Pick)  [place]

## 2022-12-28 ENCOUNTER — Other Ambulatory Visit (HOSPITAL_BASED_OUTPATIENT_CLINIC_OR_DEPARTMENT_OTHER): Payer: Self-pay | Admitting: Registered Nurse

## 2022-12-28 ENCOUNTER — Ambulatory Visit (HOSPITAL_BASED_OUTPATIENT_CLINIC_OR_DEPARTMENT_OTHER)
Admission: RE | Admit: 2022-12-28 | Discharge: 2022-12-28 | Disposition: A | Discharge status: Home / Self Care | Payer: BC Managed Care – PPO | Source: Ambulatory Visit | Attending: Registered Nurse | Admitting: Registered Nurse

## 2022-12-28 DIAGNOSIS — R109 Unspecified abdominal pain: Secondary | ICD-10-CM | POA: Insufficient documentation

## 2022-12-28 DIAGNOSIS — R1030 Lower abdominal pain, unspecified: Secondary | ICD-10-CM | POA: Diagnosis not present

## 2022-12-28 DIAGNOSIS — K7689 Other specified diseases of liver: Secondary | ICD-10-CM | POA: Diagnosis not present

## 2022-12-28 DIAGNOSIS — N281 Cyst of kidney, acquired: Secondary | ICD-10-CM | POA: Diagnosis not present

## 2022-12-28 MED ORDER — IOHEXOL 300 MG/ML  SOLN
100.0000 mL | Freq: Once | INTRAMUSCULAR | Status: AC | PRN
Start: 2022-12-28 — End: 2022-12-28
  Administered 2022-12-28: 100 mL via INTRAVENOUS

## 2022-12-29 ENCOUNTER — Encounter (HOSPITAL_BASED_OUTPATIENT_CLINIC_OR_DEPARTMENT_OTHER): Payer: Self-pay | Admitting: Registered Nurse

## 2022-12-29 DIAGNOSIS — R1030 Lower abdominal pain, unspecified: Secondary | ICD-10-CM

## 2023-01-11 ENCOUNTER — Other Ambulatory Visit: Payer: Self-pay

## 2023-01-11 DIAGNOSIS — I77810 Thoracic aortic ectasia: Secondary | ICD-10-CM

## 2023-01-13 DIAGNOSIS — R399 Unspecified symptoms and signs involving the genitourinary system: Secondary | ICD-10-CM | POA: Diagnosis not present

## 2023-01-13 DIAGNOSIS — N401 Enlarged prostate with lower urinary tract symptoms: Secondary | ICD-10-CM | POA: Diagnosis not present

## 2023-01-13 DIAGNOSIS — N281 Cyst of kidney, acquired: Secondary | ICD-10-CM | POA: Diagnosis not present

## 2023-01-13 DIAGNOSIS — R1030 Lower abdominal pain, unspecified: Secondary | ICD-10-CM | POA: Diagnosis not present

## 2023-05-18 ENCOUNTER — Encounter: Payer: Self-pay | Admitting: Internal Medicine

## 2023-05-18 ENCOUNTER — Ambulatory Visit: Payer: BC Managed Care – PPO | Attending: Internal Medicine | Admitting: Internal Medicine

## 2023-05-18 VITALS — BP 122/88 | HR 58 | Ht 71.0 in | Wt 206.2 lb

## 2023-05-18 DIAGNOSIS — I77810 Thoracic aortic ectasia: Secondary | ICD-10-CM | POA: Diagnosis not present

## 2023-05-18 DIAGNOSIS — I214 Non-ST elevation (NSTEMI) myocardial infarction: Secondary | ICD-10-CM

## 2023-05-18 DIAGNOSIS — I2510 Atherosclerotic heart disease of native coronary artery without angina pectoris: Secondary | ICD-10-CM

## 2023-05-18 NOTE — Patient Instructions (Signed)
Medication Instructions:  Your physician recommends that you continue on your current medications as directed. Please refer to the Current Medication list given to you today.  *If you need a refill on your cardiac medications before your next appointment, please call your pharmacy*   Lab Work: NONE If you have labs (blood work) drawn today and your tests are completely normal, you will receive your results only by: MyChart Message (if you have MyChart) OR A paper copy in the mail If you have any lab test that is abnormal or we need to change your treatment, we will call you to review the results.   Testing/Procedures: Your physician has requested that you have an echocardiogram. Echocardiography is a painless test that uses sound waves to create images of your heart. It provides your doctor with information about the size and shape of your heart and how well your heart's chambers and valves are working. This procedure takes approximately one hour. There are no restrictions for this procedure. TO BE DONE IN DEC. 2024 Please do NOT wear cologne, perfume, aftershave, or lotions (deodorant is allowed). Please arrive 15 minutes prior to your appointment time.    Follow-Up: At [facility], you and your health needs are our priority.  As part of our continuing mission to provide you with exceptional heart care, we have created designated Provider Care Teams.  These Care Teams include your primary Cardiologist (physician) and Advanced Practice Providers (APPs -  Physician Assistants and Nurse Practitioners) who all work together to provide you with the care you need, when you need it.  We recommend signing up for the patient portal called "MyChart".  Sign up information is provided on this After Visit Summary.  MyChart is used to connect with patients for Virtual Visits (Telemedicine).  Patients are able to view lab/test results, encounter notes, upcoming appointments, etc.  Non-urgent  messages can be sent to your provider as well.   To learn more about what you can do with MyChart, go to [email or web address].    Your next appointment:   DEC 2024  Provider:   Parke Poisson, MD

## 2023-05-18 NOTE — Progress Notes (Signed)
Cardiology Office Note:    Date:  05/18/2023   ID:  Christopher Guerrero, DOB Sep 23, 1966, MRN [id number]  PCP:  Adrian Prince, MD  Cardiologist:  Parke Poisson, MD  Electrophysiologist:  None   Referring MD: Adrian Prince, MD   Chief Complaint/Reason for Referral: NSTEMI, f/u  History of Present Illness:    Christopher Guerrero is a 57 y.o. male with a history of remote 20 pack year smoking history, premature family history of CAD, who presented with NSTEMI in 06/14/21 now s/p PCI to mid RCA.   Doing really well overall, no concerns. Has not required nitroglycerin SL.  The patient denies chest pain, chest pressure, dyspnea at rest or with exertion, palpitations, PND, orthopnea, or leg swelling. Denies cough, fever, chills. Denies nausea, vomiting. Denies syncope or presyncope. Denies dizziness or lightheadedness.     Past Medical History:  Diagnosis Date   CAD in native artery 06/15/2021   Diverticulitis    GERD (gastroesophageal reflux disease)    Gout    HLD (hyperlipidemia) 06/15/2021   S/P angioplasty with stent 06/14/21 DES to RCA 06/15/2021    Past Surgical History:  Procedure Laterality Date   ABDOMINAL DEBRIDEMENT  2009   APPENDECTOMY  2008   COLECTOMY  2007   sigmoid colon resection and colostomy   COLON SURGERY  2008   colostomy closure   COLON SURGERY  2008   resection of enterocutaneous fistula, small bowel resection, L colon resection with primary anastomosis, abd wall resection   CORONARY STENT INTERVENTION N/A 06/14/2021   Procedure: CORONARY STENT INTERVENTION;  Surgeon: Corky Crafts, MD;  Location: [facility] INVASIVE CV LAB;  Service: Cardiovascular;  Laterality: N/A;   CORONARY ULTRASOUND/IVUS N/A 06/14/2021   Procedure: Intravascular Ultrasound/IVUS;  Surgeon: Corky Crafts, MD;  Location: [facility] INVASIVE CV LAB;  Service: Cardiovascular;  Laterality: N/A;   LEFT HEART CATH AND CORONARY ANGIOGRAPHY N/A 06/14/2021   Procedure: LEFT HEART CATH AND CORONARY  ANGIOGRAPHY;  Surgeon: Corky Crafts, MD;  Location: [facility] INVASIVE CV LAB;  Service: Cardiovascular;  Laterality: N/A;    Current Medications: Current Meds  Medication Sig   acetaminophen (TYLENOL) 325 MG tablet Take 2 tablets (650 mg total) by mouth every 4 (four) hours as needed for headache or mild pain.   aspirin 81 MG chewable tablet Chew 1 tablet (81 mg total) by mouth daily.   atorvastatin (LIPITOR) 80 MG tablet Take 1 tablet (80 mg total) by mouth daily.   Cyanocobalamin (VITAMIN B-12 PO) Take 500 mg by mouth daily.   losartan (COZAAR) 25 MG tablet TAKE 1 TABLET(25 MG) BY MOUTH DAILY   nitroGLYCERIN (NITROSTAT) 0.4 MG SL tablet Place 1 tablet (0.4 mg total) under the tongue every 5 (five) minutes x 3 doses as needed for chest pain.   pantoprazole (PROTONIX) 40 MG tablet Take 1 tablet by mouth daily.   Polyethylene Glycol 3350 (MIRALAX PO) Take by mouth as needed (Constipation).     Allergies:   Hydrocodone   Social History   Tobacco Use   Smoking status: Former    Packs/day: 20.00    Years: 20.00    Additional pack years: 0.00    Total pack years: 400.00    Types: Cigarettes, Cigars   Smokeless tobacco: Never  Substance Use Topics   Alcohol use: Yes   Drug use: No     Family History: The patient's family history includes Arthritis in an other family member; Coronary artery disease in an other family member;  Hypertension in an other family member.  ROS:   Please see the history of present illness.     All other systems reviewed and are negative.  EKGs/Labs/Other Studies Reviewed:    The following studies were reviewed today:  Echo 09/13/21 1. Left ventricular ejection fraction by 3D volume is 53 %. The left  ventricle has normal function. The left ventricle has no regional wall  motion abnormalities. Left ventricular diastolic parameters were normal.   2. Right ventricular systolic function is normal. The right ventricular  size is normal. Tricuspid  regurgitation signal is inadequate for assessing  PA pressure.   3. The mitral valve is normal in structure. Trivial mitral valve  regurgitation. No evidence of mitral stenosis.   4. The aortic valve is tricuspid. Aortic valve regurgitation is trivial.  No aortic stenosis is present.   5. Aortic dilatation noted. There is mild dilatation of the aortic root,  measuring 41 mm.   6. The inferior vena cava is normal in size with greater than 50%  respiratory variability, suggesting right atrial pressure of 3 mmHg.   Echo 06/14/21 1. Left ventricular ejection fraction, by estimation, is 45 to 50%. The  left ventricle has mildly decreased function. The left ventricle  demonstrates global hypokinesis. There is mild left ventricular  hypertrophy. Left ventricular diastolic parameters  are consistent with Grade I diastolic dysfunction (impaired relaxation).   2. Right ventricular systolic function is normal. The right ventricular  size is normal.   3. The mitral valve is normal in structure. No evidence of mitral valve  regurgitation. No evidence of mitral stenosis.   4. The aortic valve is normal in structure. Aortic valve regurgitation is  not visualized. No aortic stenosis is present.   5. Aortic dilatation noted. There is mild dilatation of the ascending  aorta, measuring 41 mm.   6. The inferior vena cava is normal in size with greater than 50%  respiratory variability, suggesting right atrial pressure of 3 mmHg.  L Heart Cath 06/14/21  Mid RCA lesion is 95% stenosed.   A drug-eluting stent was successfully placed using a STENT ONYX FRONTIER [id number].  Stent was optimized with intravascular ultrasound.   Post intervention, there is a 0% residual stenosis.   Dist RCA lesion is 25% stenosed.   Mid LAD lesion is 25% stenosed.   Mid Cx lesion is 40% stenosed.  Ectasia noted in the mid vessel which causes some swirling of contrast.   The left ventricular systolic function is normal.   LV end  diastolic pressure is normal.   The left ventricular ejection fraction is 50-55% by visual estimate.   There is no aortic valve stenosis.  EKG:  EKG is personally reviewed. EKG Interpretation  Date/Time:  Thursday May 18 2023 08:14:51 EDT Ventricular Rate:  58 PR Interval:  150 QRS Duration: 94 QT Interval:  414 QTC Calculation: 406 R Axis:   6 Text Interpretation: Sinus bradycardia When compared with ECG of 15-Jun-2021 05:33, No significant change was found Confirmed by Weston Brass ([id number]) on 05/18/2023 9:53:51 AM   11/16/22: NSR. 09/23/21: No EKG ordered today 08/06/21: Sinus bradycardia with sinus arrhythmia, rate 58 bpm.   Imaging studies that I have independently reviewed today: n/a  Recent Labs: No results found for requested labs within last 365 days.  Recent Lipid Panel    Component Value Date/Time   CHOL 84 (L) 09/08/2021 0844   TRIG 70 09/08/2021 0844   HDL 37 (L) 09/08/2021 0844   CHOLHDL  2.3 09/08/2021 0844   CHOLHDL 4.9 06/14/2021 0215   VLDL 15 06/14/2021 0215   LDLCALC 32 09/08/2021 0844    Physical Exam:    VS:  BP 122/88   Pulse (!) 58   Ht 5\' 11"  (1.803 m)   Wt 206 lb 3.2 oz (93.5 kg)   BMI 28.76 kg/m     Wt Readings from Last 5 Encounters:  05/18/23 206 lb 3.2 oz (93.5 kg)  11/16/22 206 lb 6.4 oz (93.6 kg)  05/17/22 196 lb 3.2 oz (89 kg)  09/23/21 193 lb (87.5 kg)  08/06/21 199 lb 6.4 oz (90.4 kg)    Constitutional: No acute distress Eyes: sclera non-icteric, normal conjunctiva and lids ENMT: normal dentition, moist mucous membranes Cardiovascular: regular rhythm, normal rate, no murmurs. S1 and S2 normal. No jugular venous distention.  Respiratory: clear to auscultation bilaterally GI : normal bowel sounds, soft and nontender. No distention.   MSK: extremities warm, well perfused. No edema.  NEURO: grossly nonfocal exam, moves all extremities. PSYCH: alert and oriented x 3, normal mood and affect.   ASSESSMENT:    1. NSTEMI  (non-ST elevated myocardial infarction) (HCC)   2. CAD in native artery   3. Aortic root dilation (HCC)     PLAN:    NSTEMI (non-ST elevated myocardial infarction) (HCC) -  S/P angioplasty with stent 06/14/21 DES to RCA CAD in native artery  - continue ASA 81 mg daily, completed dual antiplatelet therapy for 1 year without interruption.   - mildly reduced EF on echo however slightly improved on repeat. Continue losartan 25 mg daily. No BB with baseline bradycardia. - repeat echo this year for 1 year follow up of CAD and Asc Ao dilation. Complete in Dec 2024 - has not required sl nitro.  Pure hypercholesterolemia - continue statin, lipitor 80 mg daily. LDL at goal, 57 (08/31/22)   Primary hypertension - losartan 25 mg daily, continue, BP appropriate.  Mild dilation of ascending aorta - repeat an echo in Dec.   Total time of encounter: 20 minutes total time of encounter, including 15 minutes spent in face-to-face patient care on the date of this encounter. This time includes coordination of care and counseling regarding above mentioned problem list. Remainder of non-face-to-face time involved reviewing chart documents/testing relevant to the patient encounter and documentation in the medical record. I have independently reviewed documentation from referring provider.   Weston Brass, MD, [facility] [place]  [facility]    Shared Decision Making/Informed Consent:       Medication Adjustments/Labs and Tests Ordered: Current medicines are reviewed at length with the patient today.  Concerns regarding medicines are outlined above.   Orders Placed This Encounter  Procedures   EKG 12-Lead   EKG 12-Lead   ECHOCARDIOGRAM COMPLETE     No orders of the defined types were placed in this encounter.    Patient Instructions  Medication Instructions:  Your physician recommends that you continue on your current medications as directed. Please refer to the Current Medication list  given to you today.  *If you need a refill on your cardiac medications before your next appointment, please call your pharmacy*   Lab Work: NONE If you have labs (blood work) drawn today and your tests are completely normal, you will receive your results only by: MyChart Message (if you have MyChart) OR A paper copy in the mail If you have any lab test that is abnormal or we need to change your treatment, we will  call you to review the results.   Testing/Procedures: Your physician has requested that you have an echocardiogram. Echocardiography is a painless test that uses sound waves to create images of your heart. It provides your doctor with information about the size and shape of your heart and how well your heart's chambers and valves are working. This procedure takes approximately one hour. There are no restrictions for this procedure. TO BE DONE IN DEC. 2024 Please do NOT wear cologne, perfume, aftershave, or lotions (deodorant is allowed). Please arrive 15 minutes prior to your appointment time.    Follow-Up: At [facility], you and your health needs are our priority.  As part of our continuing mission to provide you with exceptional heart care, we have created designated Provider Care Teams.  These Care Teams include your primary Cardiologist (physician) and Advanced Practice Providers (APPs -  Physician Assistants and Nurse Practitioners) who all work together to provide you with the care you need, when you need it.  We recommend signing up for the patient portal called "MyChart".  Sign up information is provided on this After Visit Summary.  MyChart is used to connect with patients for Virtual Visits (Telemedicine).  Patients are able to view lab/test results, encounter notes, upcoming appointments, etc.  Non-urgent messages can be sent to your provider as well.   To learn more about what you can do with MyChart, go to [email or web address].    Your next appointment:    DEC 2024  Provider:   Parke Poisson, MD

## 2023-06-21 DIAGNOSIS — Z1283 Encounter for screening for malignant neoplasm of skin: Secondary | ICD-10-CM | POA: Diagnosis not present

## 2023-06-21 DIAGNOSIS — D225 Melanocytic nevi of trunk: Secondary | ICD-10-CM | POA: Diagnosis not present

## 2023-06-21 DIAGNOSIS — C4441 Basal cell carcinoma of skin of scalp and neck: Secondary | ICD-10-CM | POA: Diagnosis not present

## 2023-06-21 DIAGNOSIS — X32XXXA Exposure to sunlight, initial encounter: Secondary | ICD-10-CM | POA: Diagnosis not present

## 2023-06-21 DIAGNOSIS — L570 Actinic keratosis: Secondary | ICD-10-CM | POA: Diagnosis not present

## 2023-07-18 DIAGNOSIS — R3911 Hesitancy of micturition: Secondary | ICD-10-CM | POA: Diagnosis not present

## 2023-07-18 DIAGNOSIS — N401 Enlarged prostate with lower urinary tract symptoms: Secondary | ICD-10-CM | POA: Diagnosis not present

## 2023-08-02 DIAGNOSIS — Z08 Encounter for follow-up examination after completed treatment for malignant neoplasm: Secondary | ICD-10-CM | POA: Diagnosis not present

## 2023-08-02 DIAGNOSIS — C44319 Basal cell carcinoma of skin of other parts of face: Secondary | ICD-10-CM | POA: Diagnosis not present

## 2023-08-02 DIAGNOSIS — Z85828 Personal history of other malignant neoplasm of skin: Secondary | ICD-10-CM | POA: Diagnosis not present

## 2023-09-11 DIAGNOSIS — D510 Vitamin B12 deficiency anemia due to intrinsic factor deficiency: Secondary | ICD-10-CM | POA: Diagnosis not present

## 2023-09-11 DIAGNOSIS — R7301 Impaired fasting glucose: Secondary | ICD-10-CM | POA: Diagnosis not present

## 2023-09-11 DIAGNOSIS — M109 Gout, unspecified: Secondary | ICD-10-CM | POA: Diagnosis not present

## 2023-09-11 DIAGNOSIS — E785 Hyperlipidemia, unspecified: Secondary | ICD-10-CM | POA: Diagnosis not present

## 2023-09-18 DIAGNOSIS — Z0000 Encounter for general adult medical examination without abnormal findings: Secondary | ICD-10-CM | POA: Diagnosis not present

## 2023-09-18 DIAGNOSIS — Z1339 Encounter for screening examination for other mental health and behavioral disorders: Secondary | ICD-10-CM | POA: Diagnosis not present

## 2023-09-18 DIAGNOSIS — Z23 Encounter for immunization: Secondary | ICD-10-CM | POA: Diagnosis not present

## 2023-09-18 DIAGNOSIS — Z1331 Encounter for screening for depression: Secondary | ICD-10-CM | POA: Diagnosis not present

## 2023-09-18 DIAGNOSIS — R7301 Impaired fasting glucose: Secondary | ICD-10-CM | POA: Diagnosis not present

## 2023-11-02 ENCOUNTER — Ambulatory Visit (HOSPITAL_COMMUNITY): Payer: BC Managed Care – PPO | Attending: Internal Medicine

## 2023-11-02 DIAGNOSIS — I77810 Thoracic aortic ectasia: Secondary | ICD-10-CM

## 2023-11-02 LAB — ECHOCARDIOGRAM COMPLETE
Area-P 1/2: 3.43 cm2
S' Lateral: 3.9 cm

## 2023-11-17 ENCOUNTER — Ambulatory Visit: Payer: BC Managed Care – PPO | Admitting: Internal Medicine

## 2023-12-03 DIAGNOSIS — J069 Acute upper respiratory infection, unspecified: Secondary | ICD-10-CM | POA: Diagnosis not present

## 2023-12-08 ENCOUNTER — Ambulatory Visit: Payer: BC Managed Care – PPO | Admitting: Internal Medicine

## 2024-02-27 ENCOUNTER — Other Ambulatory Visit: Payer: Self-pay | Admitting: Internal Medicine

## 2024-02-29 ENCOUNTER — Encounter: Payer: Self-pay | Admitting: Internal Medicine

## 2024-02-29 MED ORDER — LOSARTAN POTASSIUM 25 MG PO TABS: 25.0000 mg | ORAL_TABLET | Freq: Every day | ORAL | 0 refills | Status: DC

## 2024-03-20 DIAGNOSIS — Z85828 Personal history of other malignant neoplasm of skin: Secondary | ICD-10-CM | POA: Diagnosis not present

## 2024-03-20 DIAGNOSIS — Z1283 Encounter for screening for malignant neoplasm of skin: Secondary | ICD-10-CM | POA: Diagnosis not present

## 2024-03-20 DIAGNOSIS — D225 Melanocytic nevi of trunk: Secondary | ICD-10-CM | POA: Diagnosis not present

## 2024-03-20 DIAGNOSIS — Z08 Encounter for follow-up examination after completed treatment for malignant neoplasm: Secondary | ICD-10-CM | POA: Diagnosis not present

## 2024-03-20 DIAGNOSIS — X32XXXD Exposure to sunlight, subsequent encounter: Secondary | ICD-10-CM | POA: Diagnosis not present

## 2024-03-20 DIAGNOSIS — L570 Actinic keratosis: Secondary | ICD-10-CM | POA: Diagnosis not present

## 2024-03-25 ENCOUNTER — Other Ambulatory Visit: Payer: Self-pay | Admitting: Internal Medicine

## 2024-04-01 ENCOUNTER — Other Ambulatory Visit: Payer: Self-pay | Admitting: Internal Medicine

## 2024-04-02 MED ORDER — LOSARTAN POTASSIUM 25 MG PO TABS: 25.0000 mg | ORAL_TABLET | Freq: Every day | ORAL | 0 refills | Status: DC

## 2024-04-10 ENCOUNTER — Ambulatory Visit: Payer: BC Managed Care – PPO | Attending: Internal Medicine | Admitting: Internal Medicine

## 2024-04-10 VITALS — BP 136/80 | HR 77 | Ht 71.0 in | Wt 218.6 lb

## 2024-04-10 DIAGNOSIS — I10 Essential (primary) hypertension: Secondary | ICD-10-CM

## 2024-04-10 DIAGNOSIS — I2510 Atherosclerotic heart disease of native coronary artery without angina pectoris: Secondary | ICD-10-CM

## 2024-04-10 DIAGNOSIS — I214 Non-ST elevation (NSTEMI) myocardial infarction: Secondary | ICD-10-CM

## 2024-04-10 DIAGNOSIS — I77810 Thoracic aortic ectasia: Secondary | ICD-10-CM

## 2024-04-10 MED ORDER — LOSARTAN POTASSIUM 25 MG PO TABS: 25.0000 mg | ORAL_TABLET | Freq: Every day | ORAL | 3 refills | Status: AC

## 2024-04-10 MED ORDER — NITROGLYCERIN 0.4 MG SL SUBL: 0.4000 mg | SUBLINGUAL_TABLET | SUBLINGUAL | 4 refills | Status: AC | PRN

## 2024-04-10 NOTE — Progress Notes (Signed)
 " Cardiology Office Note:  .   Date:  04/10/2024  ID:  Christopher Guerrero, DOB 1965-12-27, MRN [id number] PCP: Nichole Senior, MD  [facility] Providers Cardiologist:  Soyla DELENA Merck, MD    History of Present Illness: .   Christopher Guerrero is a 58 y.o. male.  Discussed the use of AI scribe software for clinical note transcription with the patient, who gave verbal consent to proceed.  History of Present Illness Christopher Guerrero is a 58 year old male with coronary artery disease who presents for a cardiovascular follow-up. He is doing very well with no CV concerns. Continues to work and travel without issue.   He experiences difficulty with his usual exercise routine due to foot pain, which he attributes to arthritis and bunions. He walks on a treadmill at the gym, covering about four miles three to four times a week, but now finds it necessary to slow down. He uses custom-made orthotics, which help with plantar fasciitis but not as much with the bunions.  He had a heart attack in 2022, with a stent placed in the right coronary artery. He has not needed to use nitroglycerin  since his last visit and carries it in a keychain capsule. Current medications include aspirin  81 mg daily, atorvastatin  80 mg daily, and losartan  25 mg daily.    ROS: negative except per HPI above.  Studies Reviewed: SABRA   EKG Interpretation Date/Time:  Wednesday Apr 10 2024 09:17:45 EDT Ventricular Rate:  77 PR Interval:  142 QRS Duration:  86 QT Interval:  370 QTC Calculation: 418 R Axis:   61  Text Interpretation: Normal sinus rhythm Normal ECG When compared with ECG of 18-May-2023 08:14, No significant change was found Confirmed by Merck Soyla ([id number]) on 04/10/2024 9:25:37 AM    Results LABS LDL: 49 mg/dL (89/7975) Triglycerides: 119 mg/dL (89/7975) HDL: 34 mg/dL (89/7975)  DIAGNOSTIC EKG: Normal Echocardiogram: Mild aortic dilation (08/2023) Risk Assessment/Calculations:       Physical Exam:    VS:  BP 136/80 (BP Location: Left Arm, Patient Position: Sitting, Cuff Size: Large)   Pulse 77   Ht 5' 11 (1.803 m)   Wt 218 lb 9.6 oz (99.2 kg)   SpO2 98%   BMI 30.49 kg/m    Wt Readings from Last 3 Encounters:  04/10/24 218 lb 9.6 oz (99.2 kg)  05/18/23 206 lb 3.2 oz (93.5 kg)  11/16/22 206 lb 6.4 oz (93.6 kg)     Physical Exam VITALS: BP- 136/80 GENERAL: Alert, cooperative, well developed, no acute distress HEENT: Normocephalic, normal oropharynx, moist mucous membranes CHEST: Clear to auscultation bilaterally, no wheezes, rhonchi, or crackles CARDIOVASCULAR: Normal heart rate and rhythm, S1 and S2 normal without murmurs ABDOMEN: Soft, non-tender, non-distended, without organomegaly, normal bowel sounds EXTREMITIES: No cyanosis or edema NEUROLOGICAL: Cranial nerves grossly intact, moves all extremities without gross motor or sensory deficit   ASSESSMENT AND PLAN: .    Assessment and Plan Assessment & Plan Coronary artery disease with history of NSTEMI NSTEMI in 2022 with stent in RCA. Angina well-managed. LDL 49, triglycerides 119, HDL 34. Advised increased physical activity for HDL improvement. - Refill nitroglycerin  for emergency use. - Continue aspirin  81 mg daily and atorvastatin  80 mg daily. - Encourage increased physical activity.  Hypertension BP 136/80, slightly elevated. Ran out of losartan . Previous BP 122/88 with medication. - Refill losartan  25 mg daily for 90 days with three refills. - Advise regular BP monitoring and dietary modifications, including salt reduction.  Aortic dilation Mild aortic dilation on echocardiogram. No aneurysm. Advised to avoid heavy lifting. - Schedule echocardiogram before May 2026 visit. - Advise against heavy lifting.  Follow-up Annual follow-up planned for cardiovascular health and aortic dilation monitoring. - Schedule follow-up in May 2026. - Perform echocardiogram prior to next visit.      Soyla Merck, MD,  [facility] "

## 2024-04-10 NOTE — Patient Instructions (Signed)
 Medication Instructions:  No Changes *If you need a refill on your cardiac medications before your next appointment, please call your pharmacy*  Lab Work: None  Testing/Procedures: Your physician has requested that you have an echocardiogram in about one year (Due around 03/28/2025), right before returning for your one year follow up visit with Dr. Chancy Comber. Echocardiography is a painless test that uses sound waves to create images of your heart. It provides your doctor with information about the size and shape of your heart and how well your heart's chambers and valves are working. This procedure takes approximately one hour. There are no restrictions for this procedure. Please do NOT wear cologne, perfume, aftershave, or lotions (deodorant is allowed). Please arrive 15 minutes prior to your appointment time.   Follow-Up: At [facility], you and your health needs are our priority.  As part of our continuing mission to provide you with exceptional heart care, our providers are all part of one team.  This team includes your primary Cardiologist (physician) and Advanced Practice Providers or APPs (Physician Assistants and Nurse Practitioners) who all work together to provide you with the care you need, when you need it.  Your next appointment:   1 year(s)  Provider:   Gayatri A Acharya, MD    Other Instructions Please call us  or send a MyChart message with any Cardiology related questions/concerns.  [phone].  Thank you!

## 2024-04-12 ENCOUNTER — Other Ambulatory Visit: Payer: Self-pay

## 2024-04-12 ENCOUNTER — Other Ambulatory Visit (HOSPITAL_COMMUNITY): Payer: Self-pay

## 2024-04-12 MED ORDER — ATORVASTATIN CALCIUM 80 MG PO TABS
80.0000 mg | ORAL_TABLET | Freq: Every day | ORAL | 3 refills | Status: AC
  Filled 2024-04-12: qty 90, 90d supply, fill #0

## 2024-04-12 NOTE — Telephone Encounter (Signed)
 Pt's medication was sent to pt's pharmacy as requested. Confirmation received.

## 2024-06-28 DIAGNOSIS — K529 Noninfective gastroenteritis and colitis, unspecified: Secondary | ICD-10-CM | POA: Diagnosis not present

## 2024-06-28 DIAGNOSIS — I10 Essential (primary) hypertension: Secondary | ICD-10-CM | POA: Diagnosis not present

## 2024-06-28 DIAGNOSIS — E785 Hyperlipidemia, unspecified: Secondary | ICD-10-CM | POA: Diagnosis not present

## 2024-06-28 DIAGNOSIS — D510 Vitamin B12 deficiency anemia due to intrinsic factor deficiency: Secondary | ICD-10-CM | POA: Diagnosis not present

## 2024-06-28 DIAGNOSIS — R7301 Impaired fasting glucose: Secondary | ICD-10-CM | POA: Diagnosis not present

## 2024-06-28 DIAGNOSIS — M109 Gout, unspecified: Secondary | ICD-10-CM | POA: Diagnosis not present

## 2024-07-17 ENCOUNTER — Other Ambulatory Visit (HOSPITAL_COMMUNITY): Payer: Self-pay

## 2024-07-22 DIAGNOSIS — N401 Enlarged prostate with lower urinary tract symptoms: Secondary | ICD-10-CM | POA: Diagnosis not present

## 2024-07-22 DIAGNOSIS — R3912 Poor urinary stream: Secondary | ICD-10-CM | POA: Diagnosis not present

## 2024-08-08 ENCOUNTER — Other Ambulatory Visit: Payer: Self-pay | Admitting: Gastroenterology

## 2024-08-08 DIAGNOSIS — R1032 Left lower quadrant pain: Secondary | ICD-10-CM | POA: Diagnosis not present

## 2024-08-08 DIAGNOSIS — K219 Gastro-esophageal reflux disease without esophagitis: Secondary | ICD-10-CM | POA: Diagnosis not present

## 2024-08-08 DIAGNOSIS — R112 Nausea with vomiting, unspecified: Secondary | ICD-10-CM | POA: Diagnosis not present

## 2024-08-08 DIAGNOSIS — R194 Change in bowel habit: Secondary | ICD-10-CM | POA: Diagnosis not present

## 2024-08-15 ENCOUNTER — Telehealth: Payer: Self-pay

## 2024-08-15 NOTE — Telephone Encounter (Signed)
 1st attempt: Called patient to contact our office to schedule telehealth appointment for cardiac clearance.

## 2024-08-15 NOTE — Telephone Encounter (Signed)
   Name: Christopher Guerrero  DOB: February 13, 1966  MRN: [id number]  Primary Cardiologist: Soyla DELENA Merck, MD  Chart reviewed as part of pre-operative protocol coverage. Because of [name] past medical history and time since last visit, he will require a follow-up telephone visit in order to better assess preoperative cardiovascular risk.  Pre-op covering staff: - Please schedule appointment and call patient to inform them. If patient already had an upcoming appointment within acceptable timeframe, please add pre-op clearance to the appointment notes so provider is aware. - Please contact requesting surgeon's office via preferred method (i.e, phone, fax) to inform them of need for appointment prior to surgery.  Remote history of NSTEMI back in 2022 with stent to the RCA, would prefer for ASA 81 mg to be continued throughout procedure but if bleeding risk is too high may hold ASA x 7 days prior to procedure and resume when medically safe to do so.  [name], PA-C  08/15/2024, 2:32 PM

## 2024-08-15 NOTE — Telephone Encounter (Signed)
   Pre-operative Risk Assessment    Patient Name: Christopher Guerrero  DOB: May 04, 1966 MRN: [id number]   Date of last office visit: 04/10/24 SOYLA MERCK, MD Date of next office visit: NONE   Request for Surgical Clearance    Procedure:  EGD AND COLONOSCOPY  Date of Surgery:  Clearance 08/30/24                                Surgeon:  DR KRISTIE Surgeon's Group or Practice Name:  [facility], [place] Phone number:  [phone] Fax number:  [phone]   Type of Clearance Requested:   - Medical  - Pharmacy:  Hold Aspirin      Type of Anesthesia:  PROPOFOL   Additional requests/questions:    Signed, Lucie DELENA Ku   08/15/2024, 2:28 PM

## 2024-08-16 ENCOUNTER — Ambulatory Visit
Admission: RE | Admit: 2024-08-16 | Discharge: 2024-08-16 | Disposition: A | Discharge status: Home / Self Care | Source: Ambulatory Visit | Attending: Gastroenterology | Admitting: Gastroenterology

## 2024-08-16 ENCOUNTER — Telehealth: Payer: Self-pay

## 2024-08-16 DIAGNOSIS — K5730 Diverticulosis of large intestine without perforation or abscess without bleeding: Secondary | ICD-10-CM | POA: Diagnosis not present

## 2024-08-16 DIAGNOSIS — R1032 Left lower quadrant pain: Secondary | ICD-10-CM

## 2024-08-16 MED ORDER — IOPAMIDOL (ISOVUE-300) INJECTION 61%
100.0000 mL | Freq: Once | INTRAVENOUS | Status: AC | PRN
  Administered 2024-08-16: 100 mL via INTRAVENOUS

## 2024-08-16 NOTE — Telephone Encounter (Signed)
 Appointment scheduled 08/28/24 @ 100 Med rec Consent done

## 2024-08-16 NOTE — Telephone Encounter (Signed)
  Patient Consent for Virtual Visit        Christopher Guerrero has provided verbal consent on 08/16/2024 for a virtual visit (video or telephone).   CONSENT FOR VIRTUAL VISIT FOR:  Christopher Guerrero  By participating in this virtual visit I agree to the following:  I hereby voluntarily request, consent and authorize [facility] and its employed or contracted physicians, physician assistants, nurse practitioners or other licensed health care professionals (the Practitioner), to provide me with telemedicine health care services (the "Services) as deemed necessary by the treating Practitioner. I acknowledge and consent to receive the Services by the Practitioner via telemedicine. I understand that the telemedicine visit will involve communicating with the Practitioner through live audiovisual communication technology and the disclosure of certain medical information by electronic transmission. I acknowledge that I have been given the opportunity to request an in-person assessment or other available alternative prior to the telemedicine visit and am voluntarily participating in the telemedicine visit.  I understand that I have the right to withhold or withdraw my consent to the use of telemedicine in the course of my care at any time, without affecting my right to future care or treatment, and that the Practitioner or I may terminate the telemedicine visit at any time. I understand that I have the right to inspect all information obtained and/or recorded in the course of the telemedicine visit and may receive copies of available information for a reasonable fee.  I understand that some of the potential risks of receiving the Services via telemedicine include:  Delay or interruption in medical evaluation due to technological equipment failure or disruption; Information transmitted may not be sufficient (e.g. poor resolution of images) to allow for appropriate medical decision making by the Practitioner;  and/or  In rare instances, security protocols could fail, causing a breach of personal health information.  Furthermore, I acknowledge that it is my responsibility to provide information about my medical history, conditions and care that is complete and accurate to the best of my ability. I acknowledge that Practitioner's advice, recommendations, and/or decision may be based on factors not within their control, such as incomplete or inaccurate data provided by me or distortions of diagnostic images or specimens that may result from electronic transmissions. I understand that the practice of medicine is not an exact science and that Practitioner makes no warranties or guarantees regarding treatment outcomes. I acknowledge that a copy of this consent can be made available to me via my patient portal [facility] MyChart), or I can request a printed copy by calling the office of [facility].    I understand that my insurance will be billed for this visit.   I have read or had this consent read to me. I understand the contents of this consent, which adequately explains the benefits and risks of the Services being provided via telemedicine.  I have been provided ample opportunity to ask questions regarding this consent and the Services and have had my questions answered to my satisfaction. I give my informed consent for the services to be provided through the use of telemedicine in my medical care

## 2024-08-26 ENCOUNTER — Ambulatory Visit: Attending: Internal Medicine | Admitting: Nurse Practitioner

## 2024-08-26 ENCOUNTER — Encounter: Payer: Self-pay | Admitting: Nurse Practitioner

## 2024-08-26 DIAGNOSIS — Z01810 Encounter for preprocedural cardiovascular examination: Secondary | ICD-10-CM | POA: Diagnosis not present

## 2024-08-26 NOTE — Progress Notes (Signed)
 Virtual Visit via Telephone Note   Because of Christopher Guerrero co-morbid illnesses, he is at least at moderate risk for complications without adequate follow up.  This format is felt to be most appropriate for this patient at this time.  Due to technical limitations with video connection (technology), today's appointment will be conducted as an audio only telehealth visit, and Christopher Guerrero verbally agreed to proceed in this manner.   All issues noted in this document were discussed and addressed.  No physical exam could be performed with this format.  Evaluation Performed:  Preoperative cardiovascular risk assessment _____________   Date:  08/26/2024   Patient ID:  Christopher Guerrero, DOB 1966/05/23, MRN [id number] Patient Location:  Home Provider location:   Office  Primary Care Provider:  Nichole Senior, MD Primary Cardiologist:  Soyla DELENA Merck, MD  Chief Complaint / Patient Profile   58 y.o. y/o male with a h/o CAD, NSTEMI in 2022 with stent to RCA, hyperlipidemia, hypertension, mild aortic dilatation who is pending EGD and colonoscopy with Dr. Kristie on 08/30/24 and presents today for telephonic preoperative cardiovascular risk assessment.  History of Present Illness    Christopher Guerrero is a 58 y.o. male who presents via audio/video conferencing for a telehealth visit today.  Pt was last seen in cardiology clinic on 04/10/24 by Dr. Merck.  At that time Christopher Guerrero was doing well.  The patient is now pending procedure as outlined above. Since his last visit, he  denies chest pain, shortness of breath, lower extremity edema, fatigue, palpitations, melena, hematuria, hemoptysis, diaphoresis, weakness, presyncope, syncope, orthopnea, and PND. He remains active with work in [employer], walking, and yard work and is able to achieve > 4 METS Activity without concerning cardiac symptoms.    Past Medical History    Past Medical History:  Diagnosis Date   CAD in native artery 06/15/2021    Diverticulitis    GERD (gastroesophageal reflux disease)    Gout    HLD (hyperlipidemia) 06/15/2021   S/P angioplasty with stent 06/14/21 DES to RCA 06/15/2021   Past Surgical History:  Procedure Laterality Date   ABDOMINAL DEBRIDEMENT  2009   APPENDECTOMY  2008   COLECTOMY  2007   sigmoid colon resection and colostomy   COLON SURGERY  2008   colostomy closure   COLON SURGERY  2008   resection of enterocutaneous fistula, small bowel resection, L colon resection with primary anastomosis, abd wall resection   CORONARY STENT INTERVENTION N/A 06/14/2021   Procedure: CORONARY STENT INTERVENTION;  Surgeon: Dann Candyce RAMAN, MD;  Location: [facility] INVASIVE CV LAB;  Service: Cardiovascular;  Laterality: N/A;   CORONARY ULTRASOUND/IVUS N/A 06/14/2021   Procedure: Intravascular Ultrasound/IVUS;  Surgeon: Dann Candyce RAMAN, MD;  Location: [facility] INVASIVE CV LAB;  Service: Cardiovascular;  Laterality: N/A;   LEFT HEART CATH AND CORONARY ANGIOGRAPHY N/A 06/14/2021   Procedure: LEFT HEART CATH AND CORONARY ANGIOGRAPHY;  Surgeon: Dann Candyce RAMAN, MD;  Location: [facility] INVASIVE CV LAB;  Service: Cardiovascular;  Laterality: N/A;    Allergies  Allergies  Allergen Reactions   Hydrocodone Hives    Home Medications    Prior to Admission medications   Medication Sig Start Date End Date Taking? Authorizing Provider  acetaminophen  (TYLENOL ) 325 MG tablet Take 2 tablets (650 mg total) by mouth every 4 (four) hours as needed for headache or mild pain. 06/15/21   Marylu Leita SAUNDERS, NP  aspirin  81 MG chewable tablet Chew 1 tablet (81 mg  total) by mouth daily. 06/16/21   Marylu Leita SAUNDERS, NP  atorvastatin  (LIPITOR ) 80 MG tablet Take 1 tablet (80 mg total) by mouth daily. 04/12/24   Acharya, Gayatri A, MD  Cyanocobalamin  (VITAMIN B-12 PO) Take 500 mg by mouth daily.    [provider]  losartan  (COZAAR ) 25 MG tablet Take 1 tablet (25 mg total) by mouth daily. 04/10/24   Acharya, Gayatri A, MD  nitroGLYCERIN   (NITROSTAT ) 0.4 MG SL tablet Place 1 tablet (0.4 mg total) under the tongue every 5 (five) minutes x 3 doses as needed for chest pain. If you need more than 3 doses in a period of 24 hours, please call 911 04/10/24   Acharya, Gayatri A, MD  pantoprazole  (PROTONIX ) 40 MG tablet Take 1 tablet by mouth daily. 05/25/17   [provider]  Polyethylene Glycol 3350 (MIRALAX PO) Take by mouth as needed (Constipation).    [provider]    Physical Exam    Vital Signs:  Christopher Guerrero does not have vital signs available for review today.  Given telephonic nature of communication, physical exam is limited. AAOx3. NAD. Normal affect.  Speech and respirations are unlabored.  Accessory Clinical Findings    None  Assessment & Plan    1.  Preoperative Cardiovascular Risk Assessment: According to the Revised Cardiac Risk Index (RCRI), his Perioperative Risk of Major Cardiac Event is (%): 0.9. His Functional Capacity in METs is: 8.23 according to the [employer] Activity Status Index (DASI). The patient is doing well from a cardiac perspective. Therefore, based on ACC/AHA guidelines, the patient would be at acceptable risk for the planned procedure without further cardiovascular testing.   The patient was advised that if he develops new symptoms prior to surgery to contact our office to arrange for a follow-up visit, and he verbalized understanding.  Ideally aspirin  should be continued without interruption, however if the bleeding risk is too great, aspirin  may be held for 5-7 days prior to surgery. Please resume aspirin  post operatively when it is felt to be safe from a bleeding standpoint.   A copy of this note will be routed to requesting surgeon.  Time:   Today, I have spent 10 minutes with the patient with telehealth technology discussing medical history, symptoms, and management plan.     Percy Rosaline HERO, NP  08/26/2024, 10:07 AM

## 2024-08-30 DIAGNOSIS — R194 Change in bowel habit: Secondary | ICD-10-CM | POA: Diagnosis not present

## 2024-08-30 DIAGNOSIS — R112 Nausea with vomiting, unspecified: Secondary | ICD-10-CM | POA: Diagnosis not present

## 2024-08-30 DIAGNOSIS — K5730 Diverticulosis of large intestine without perforation or abscess without bleeding: Secondary | ICD-10-CM | POA: Diagnosis not present

## 2024-08-30 DIAGNOSIS — K219 Gastro-esophageal reflux disease without esophagitis: Secondary | ICD-10-CM | POA: Diagnosis not present

## 2024-08-30 DIAGNOSIS — K621 Rectal polyp: Secondary | ICD-10-CM | POA: Diagnosis not present

## 2024-08-30 DIAGNOSIS — Z1211 Encounter for screening for malignant neoplasm of colon: Secondary | ICD-10-CM | POA: Diagnosis not present

## 2024-08-30 DIAGNOSIS — K648 Other hemorrhoids: Secondary | ICD-10-CM | POA: Diagnosis not present

## 2024-08-30 DIAGNOSIS — Z980 Intestinal bypass and anastomosis status: Secondary | ICD-10-CM | POA: Diagnosis not present
